# Patient Record
Sex: Female | Born: 1995 | Race: Black or African American | Hispanic: No | Marital: Married | Smoking: Never smoker
Health system: Southern US, Community
[De-identification: ages and names within clinical notes are randomized; demographics above are authoritative.]

## PROBLEM LIST (undated history)

## (undated) DIAGNOSIS — Z789 Other specified health status: Secondary | ICD-10-CM

## (undated) HISTORY — PX: NO PAST SURGERIES: SHX2092

## (undated) HISTORY — DX: Other specified health status: Z78.9

---
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
## 2017-11-06 IMAGING — CR DG CHEST 2V
2 series · 2 of 2 positions shown · non-contrast
Comparison: None.

CLINICAL DATA: Syncope.

EXAM:
CHEST  2 VIEW

[chest lat]
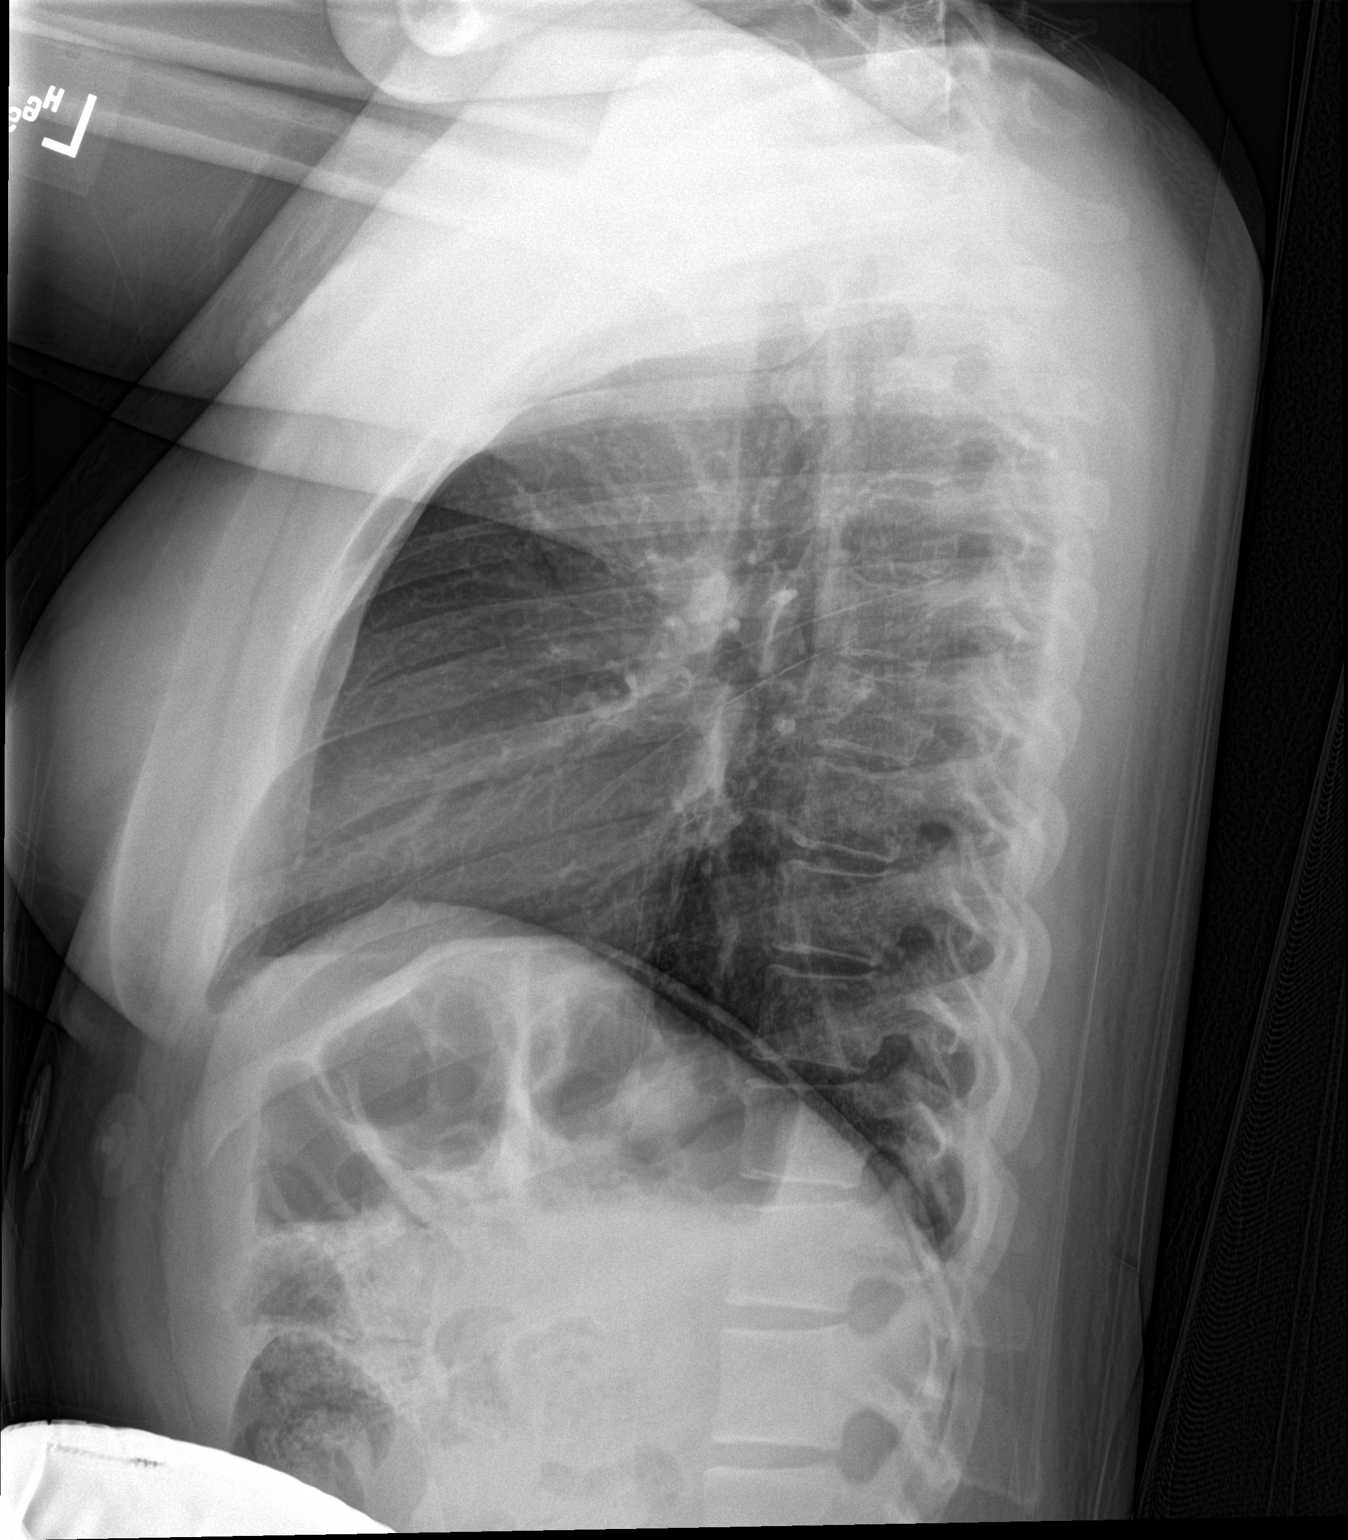

[chest ap]
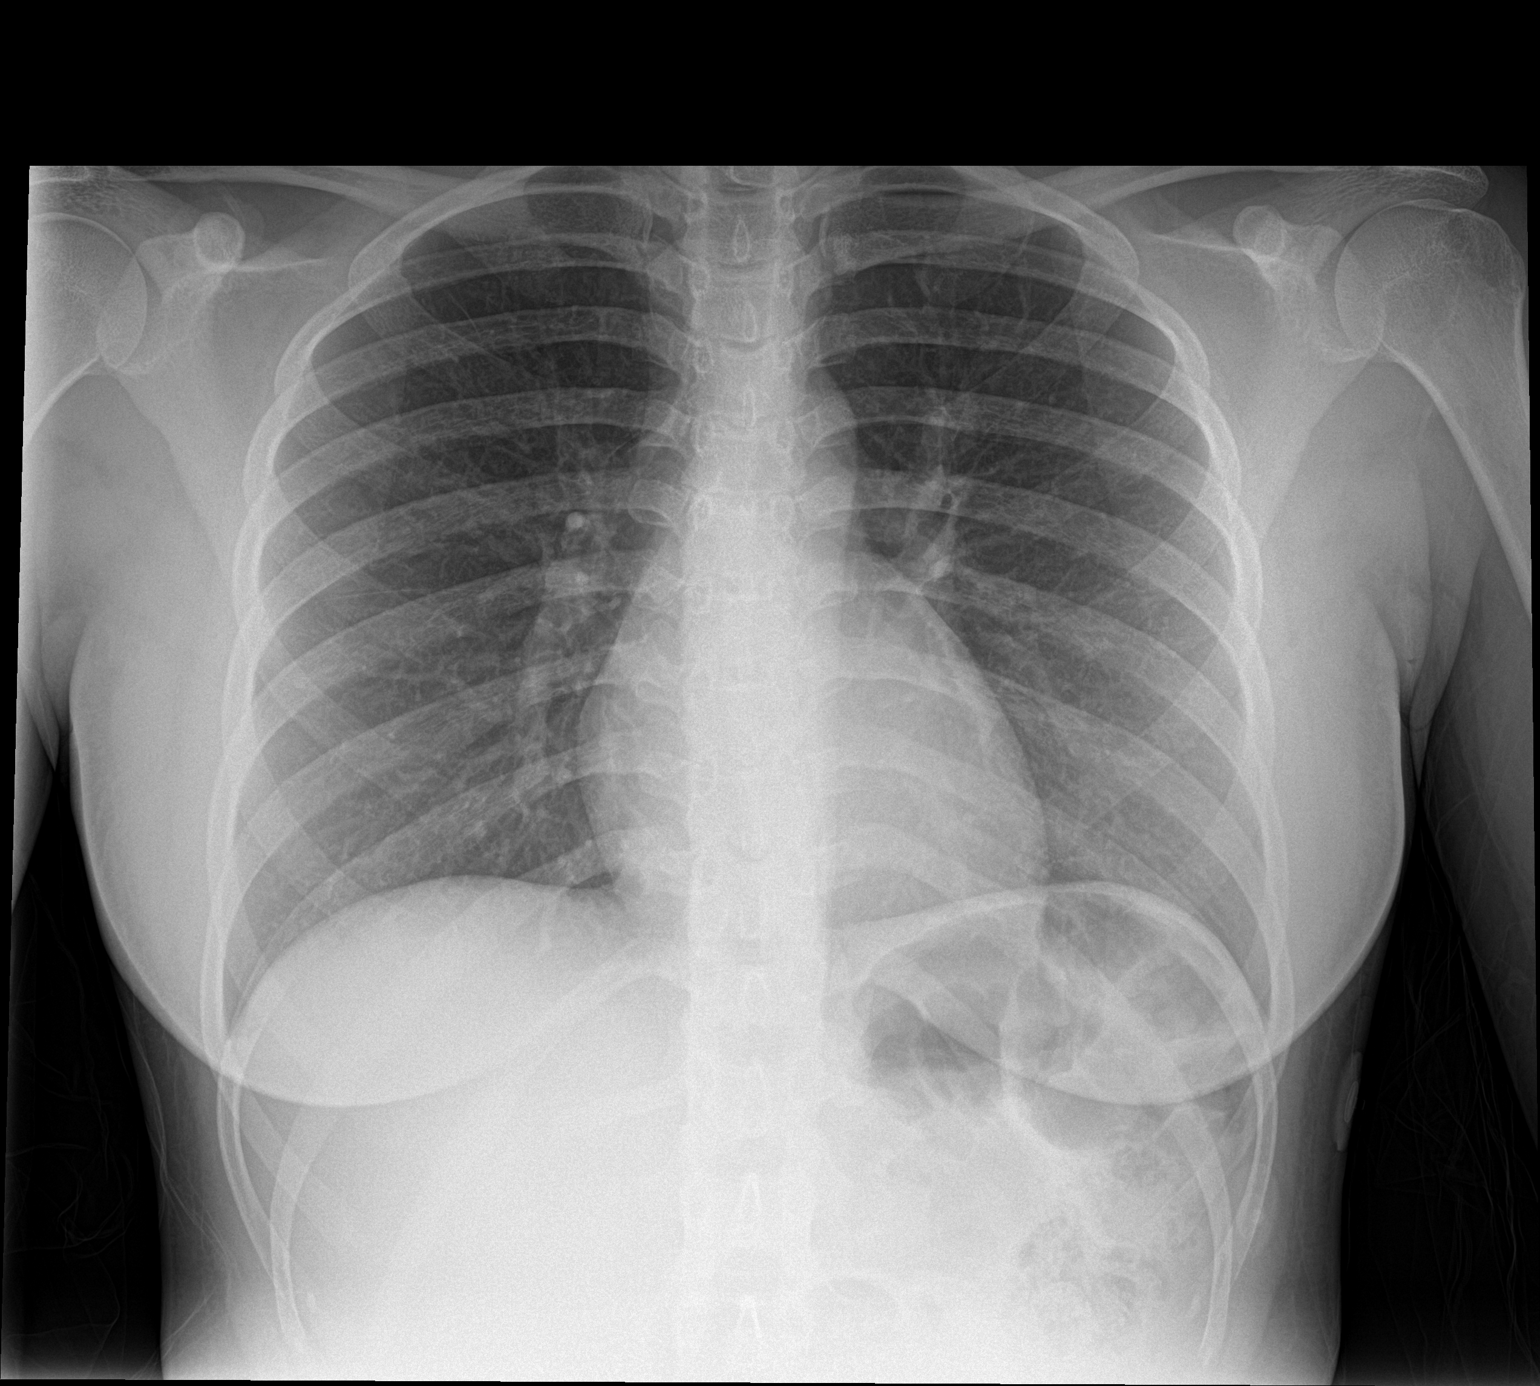

[2 of 2 positions shown; findings below may reference images not displayed]

FINDINGS: The heart size and mediastinal contours are within normal limits.
Both lungs are clear. The visualized skeletal structures are
unremarkable.
IMPRESSION: No active cardiopulmonary disease.

## 2019-02-18 ENCOUNTER — Ambulatory Visit: Payer: Self-pay | Attending: Internal Medicine

## 2019-02-18 DIAGNOSIS — Z20822 Contact with and (suspected) exposure to covid-19: Secondary | ICD-10-CM

## 2019-02-18 DIAGNOSIS — Z20828 Contact with and (suspected) exposure to other viral communicable diseases: Secondary | ICD-10-CM | POA: Insufficient documentation

## 2019-02-20 LAB — NOVEL CORONAVIRUS, NAA: SARS-CoV-2, NAA: NOT DETECTED

## 2019-03-12 DIAGNOSIS — Z13220 Encounter for screening for lipoid disorders: Secondary | ICD-10-CM | POA: Diagnosis not present

## 2019-03-12 DIAGNOSIS — Z79899 Other long term (current) drug therapy: Secondary | ICD-10-CM | POA: Diagnosis not present

## 2019-03-12 DIAGNOSIS — Z124 Encounter for screening for malignant neoplasm of cervix: Secondary | ICD-10-CM | POA: Diagnosis not present

## 2019-03-12 DIAGNOSIS — Z1159 Encounter for screening for other viral diseases: Secondary | ICD-10-CM | POA: Diagnosis not present

## 2019-03-12 DIAGNOSIS — Z130 Encounter for screening for diseases of the blood and blood-forming organs and certain disorders involving the immune mechanism: Secondary | ICD-10-CM | POA: Diagnosis not present

## 2019-03-12 DIAGNOSIS — Z23 Encounter for immunization: Secondary | ICD-10-CM | POA: Diagnosis not present

## 2019-03-12 DIAGNOSIS — Z01419 Encounter for gynecological examination (general) (routine) without abnormal findings: Secondary | ICD-10-CM | POA: Diagnosis not present

## 2019-03-12 LAB — LIPID PANEL
Cholesterol: 138 (ref 0–200)
HDL: 69 (ref 35–70)
LDL Cholesterol: 54
Triglycerides: 73 (ref 40–160)

## 2019-03-12 LAB — CBC: RBC: 4.11 (ref 3.87–5.11)

## 2019-03-12 LAB — COMPREHENSIVE METABOLIC PANEL: GFR calc Af Amer: 144

## 2019-03-12 LAB — BASIC METABOLIC PANEL
BUN: 13 (ref 4–21)
CO2: 25 — AB (ref 13–22)
Chloride: 103 (ref 99–108)
Glucose: 79
Potassium: 3.9 (ref 3.4–5.3)
Sodium: 137 (ref 137–147)

## 2019-03-12 LAB — CBC AND DIFFERENTIAL
HCT: 38 (ref 36–46)
Hemoglobin: 12.6 (ref 12.0–16.0)
Platelets: 243 (ref 150–399)
WBC: 6.2

## 2019-03-12 LAB — HEPATIC FUNCTION PANEL
ALT: 12 (ref 3–30)
AST: 20 (ref 2–40)
Alkaline Phosphatase: 36 (ref 25–125)

## 2019-03-12 LAB — HM PAP SMEAR: HM Pap smear: NEGATIVE

## 2019-12-25 ENCOUNTER — Ambulatory Visit: Payer: Self-pay | Admitting: Family Medicine

## 2020-01-09 ENCOUNTER — Ambulatory Visit (INDEPENDENT_AMBULATORY_CARE_PROVIDER_SITE_OTHER): Payer: BC Managed Care – PPO | Admitting: Family Medicine

## 2020-01-09 ENCOUNTER — Other Ambulatory Visit: Payer: Self-pay

## 2020-01-09 ENCOUNTER — Encounter: Payer: Self-pay | Admitting: Family Medicine

## 2020-01-09 VITALS — BP 100/70 | HR 93 | Temp 98.1°F | Ht <= 58 in | Wt 136.5 lb

## 2020-01-09 DIAGNOSIS — L731 Pseudofolliculitis barbae: Secondary | ICD-10-CM | POA: Insufficient documentation

## 2020-01-09 NOTE — Progress Notes (Signed)
Subjective:     Linda Kidd is a 24 y.o. female presenting for Establish Care and Adenopathy (under L armpit mostly, but occasionally under R, more frequently over last year)     HPI   #Swollen lymph nodes - pop up randomly - tried limiting deodorant, shaving - still have them - no association with menstrual period - sometimes painful, sometimes not - will last days to weeks -    Discussed with previous PCP in Jan 2021 and CBC w/ diff was normal  Review of Systems  Constitutional: Negative for chills, diaphoresis, fatigue, fever and unexpected weight change.  Gastrointestinal: Negative for abdominal pain, diarrhea, nausea and vomiting.  Musculoskeletal: Negative for arthralgias and myalgias.     Social History   Tobacco Use  Smoking Status Never Smoker  Smokeless Tobacco Never Used        Objective:    BP Readings from Last 3 Encounters:  01/09/20 100/70  09/27/15 99/68  01/16/15 119/72 (87 %, Z = 1.14 /  78 %, Z = 0.77)*   *BP percentiles are based on the 2017 AAP Clinical Practice Guideline for girls   Wt Readings from Last 3 Encounters:  01/09/20 136 lb 8 oz (61.9 kg)  09/27/15 117 lb (53.1 kg) (34 %, Z= -0.42)*  01/16/15 119 lb 1.6 oz (54 kg) (42 %, Z= -0.21)*   * Growth percentiles are based on CDC (Girls, 2-20 Years) data.    BP 100/70   Pulse 93   Temp 98.1 F (36.7 C) (Temporal)   Ht 4\' 9"  (1.448 m)   Wt 136 lb 8 oz (61.9 kg)   LMP 12/16/2019 (Exact Date)   SpO2 98%   BMI 29.54 kg/m    Physical Exam Constitutional:      General: She is not in acute distress.    Appearance: She is well-developed. She is not diaphoretic.  HENT:     Right Ear: External ear normal.     Left Ear: External ear normal.  Eyes:     Conjunctiva/sclera: Conjunctivae normal.  Cardiovascular:     Rate and Rhythm: Normal rate and regular rhythm.     Heart sounds: No murmur heard.   Pulmonary:     Effort: Pulmonary effort is normal. No respiratory  distress.     Breath sounds: Normal breath sounds.  Musculoskeletal:     Cervical back: Neck supple.  Lymphadenopathy:     Upper Body:     Right upper body: No axillary adenopathy.     Left upper body: No axillary adenopathy.  Skin:    General: Skin is warm and dry.     Capillary Refill: Capillary refill takes less than 2 seconds.     Comments: Left axilla with raised papule with Debois head.   Neurological:     Mental Status: She is alert. Mental status is at baseline.  Psychiatric:        Mood and Affect: Mood normal.        Behavior: Behavior normal.           Assessment & Plan:   Problem List Items Addressed This Visit      Musculoskeletal and Integument   Ingrown hair - Primary    Lesion on exam today is not consistent with prior lesions. Today more consistent with ingrown hair. Reviewed outside labs with normal CBC and CMP. Pt without systemic signs of illness and otherwise normal exam. She will return or send photo if symptoms recur. Can consider dermatology  if diagnosis unclear on repeat exam.           Return in about 1 year (around 01/08/2021) for annual.  Lynnda Child, MD  This visit occurred during the SARS-CoV-2 public health emergency.  Safety protocols were in place, including screening questions prior to the visit, additional usage of staff PPE, and extensive cleaning of exam room while observing appropriate contact time as indicated for disinfecting solutions.

## 2020-01-09 NOTE — Assessment & Plan Note (Signed)
Lesion on exam today is not consistent with prior lesions. Today more consistent with ingrown hair. Reviewed outside labs with normal CBC and CMP. Pt without systemic signs of illness and otherwise normal exam. She will return or send photo if symptoms recur. Can consider dermatology if diagnosis unclear on repeat exam.

## 2020-01-09 NOTE — Patient Instructions (Signed)
Make an appointment when you get the lesions  Or upload a photo

## 2020-01-30 ENCOUNTER — Encounter: Payer: Self-pay | Admitting: Family Medicine

## 2020-02-24 ENCOUNTER — Encounter: Payer: Self-pay | Admitting: Family Medicine

## 2020-02-24 MED ORDER — JUNEL FE 1/20 1-20 MG-MCG PO TABS: 1.0000 | ORAL_TABLET | Freq: Every day | ORAL | 2 refills | Status: DC

## 2020-03-03 ENCOUNTER — Other Ambulatory Visit: Payer: Self-pay

## 2020-03-03 ENCOUNTER — Encounter: Payer: Self-pay | Admitting: Family Medicine

## 2020-03-03 ENCOUNTER — Ambulatory Visit (INDEPENDENT_AMBULATORY_CARE_PROVIDER_SITE_OTHER): Payer: Self-pay | Admitting: Family Medicine

## 2020-03-03 VITALS — BP 100/78 | HR 91 | Temp 96.7°F | Ht <= 58 in | Wt 136.5 lb

## 2020-03-03 DIAGNOSIS — L509 Urticaria, unspecified: Secondary | ICD-10-CM | POA: Insufficient documentation

## 2020-03-03 NOTE — Progress Notes (Signed)
Subjective:     Linda Kidd is a 25 y.o. female presenting for Referral (Allergen test ) and Urticaria (Happens occasionally and appears mostly on stomach and lower back )     Urticaria This is a recurrent problem. The problem has been waxing and waning since onset. The affected locations include the abdomen, torso and back. The rash is characterized by itchiness, redness and swelling. She was exposed to nothing (cannot recall any exposures). Pertinent negatives include no congestion, cough, diarrhea, facial edema, fatigue, fever, joint pain, shortness of breath, sore throat or vomiting. Past treatments include antihistamine. The treatment provided significant relief. Her past medical history is significant for allergies (grass) and eczema. There is no history of asthma.   Hives have occurred on 2 occasions over the last month  Once ?Shellfish - crab legs and shrimp -- but ate 1 week later w/o change Episode 2: uncertain  No worsening allergies - about the same as before  No current hives   Review of Systems  Constitutional: Negative for fatigue and fever.  HENT: Negative for congestion and sore throat.   Respiratory: Negative for cough and shortness of breath.   Gastrointestinal: Negative for diarrhea and vomiting.  Musculoskeletal: Negative for joint pain.     Social History   Tobacco Use  Smoking Status Never Smoker  Smokeless Tobacco Never Used        Objective:    BP Readings from Last 3 Encounters:  03/03/20 100/78  01/09/20 100/70  09/27/15 99/68   Wt Readings from Last 3 Encounters:  03/03/20 136 lb 8 oz (61.9 kg)  01/09/20 136 lb 8 oz (61.9 kg)  09/27/15 117 lb (53.1 kg) (34 %, Z= -0.42)*   * Growth percentiles are based on CDC (Girls, 2-20 Years) data.    BP 100/78   Pulse 91   Temp (!) 96.7 F (35.9 C) (Temporal)   Ht 4\' 9"  (1.448 m)   Wt 136 lb 8 oz (61.9 kg)   LMP 02/14/2020 (Exact Date)   SpO2 100%   BMI 29.54 kg/m    Physical  Exam Constitutional:      General: She is not in acute distress.    Appearance: She is well-developed. She is not diaphoretic.  HENT:     Right Ear: External ear normal.     Left Ear: External ear normal.  Eyes:     Conjunctiva/sclera: Conjunctivae normal.  Cardiovascular:     Rate and Rhythm: Normal rate.  Pulmonary:     Effort: Pulmonary effort is normal.  Musculoskeletal:     Cervical back: Neck supple.  Skin:    General: Skin is warm and dry.     Capillary Refill: Capillary refill takes less than 2 seconds.  Neurological:     Mental Status: She is alert. Mental status is at baseline.  Psychiatric:        Mood and Affect: Mood normal.        Behavior: Behavior normal.           Assessment & Plan:   Problem List Items Addressed This Visit      Musculoskeletal and Integument   Urticaria - Primary    2 episodes of urticaria with quick resolution with benadryl. Advised continued monitoring. She will contact if recurrence persists and will plan for allergy referral.           Return if symptoms worsen or fail to improve.  02/16/2020, MD  This visit occurred during the  SARS-CoV-2 public health emergency.  Safety protocols were in place, including screening questions prior to the visit, additional usage of staff PPE, and extensive cleaning of exam room while observing appropriate contact time as indicated for disinfecting solutions.

## 2020-03-03 NOTE — Assessment & Plan Note (Signed)
2 episodes of urticaria with quick resolution with benadryl. Advised continued monitoring. She will contact if recurrence persists and will plan for allergy referral.

## 2020-03-03 NOTE — Patient Instructions (Addendum)
Continue to monitor - make sure clothes are clean - avoid scented products - keep a food journal -- 24 hours before hives, most important would be within 1 hour  Mychart if you decide you want to see the allergist

## 2020-05-11 ENCOUNTER — Other Ambulatory Visit: Payer: Self-pay | Admitting: Family Medicine

## 2020-10-13 ENCOUNTER — Encounter: Payer: Self-pay | Admitting: Family Medicine

## 2020-10-13 DIAGNOSIS — L0293 Carbuncle, unspecified: Secondary | ICD-10-CM

## 2020-10-13 NOTE — Addendum Note (Signed)
Addended by: Lynnda Child on: 10/13/2020 01:51 PM   Modules accepted: Orders

## 2021-02-04 ENCOUNTER — Other Ambulatory Visit: Payer: Self-pay | Admitting: Family Medicine

## 2021-02-04 NOTE — Telephone Encounter (Signed)
Lvm /mychart letter for pt to schedule a lab/cpe

## 2021-02-05 ENCOUNTER — Telehealth: Payer: Self-pay | Admitting: Family Medicine

## 2021-02-05 NOTE — Telephone Encounter (Signed)
2nd attempt  Call went straight to VM, LMTCB to schedule cpe and labs

## 2021-02-05 NOTE — Telephone Encounter (Signed)
Talk to pt and pt stated she will call back to schedule a cpe/alb because she just got married and husband is in [employer] and pt might be moving

## 2021-03-12 ENCOUNTER — Ambulatory Visit (INDEPENDENT_AMBULATORY_CARE_PROVIDER_SITE_OTHER): Payer: Self-pay | Admitting: Family

## 2021-03-12 ENCOUNTER — Other Ambulatory Visit: Payer: Self-pay

## 2021-03-12 ENCOUNTER — Encounter: Payer: Self-pay | Admitting: Family

## 2021-03-12 VITALS — BP 104/70 | HR 97 | Temp 96.8°F | Ht <= 58 in | Wt 139.0 lb

## 2021-03-12 DIAGNOSIS — Z23 Encounter for immunization: Secondary | ICD-10-CM

## 2021-03-12 DIAGNOSIS — Z13220 Encounter for screening for lipoid disorders: Secondary | ICD-10-CM

## 2021-03-12 DIAGNOSIS — Z0000 Encounter for general adult medical examination without abnormal findings: Secondary | ICD-10-CM

## 2021-03-12 DIAGNOSIS — L732 Hidradenitis suppurativa: Secondary | ICD-10-CM

## 2021-03-12 DIAGNOSIS — Z3041 Encounter for surveillance of contraceptive pills: Secondary | ICD-10-CM

## 2021-03-12 DIAGNOSIS — Z114 Encounter for screening for human immunodeficiency virus [HIV]: Secondary | ICD-10-CM

## 2021-03-12 LAB — COMPREHENSIVE METABOLIC PANEL
ALT: 9 U/L (ref 0–35)
AST: 19 U/L (ref 0–37)
Albumin: 4.5 g/dL (ref 3.5–5.2)
Alkaline Phosphatase: 50 U/L (ref 39–117)
BUN: 13 mg/dL (ref 6–23)
CO2: 28 mEq/L (ref 19–32)
Calcium: 9.2 mg/dL (ref 8.4–10.5)
Chloride: 104 mEq/L (ref 96–112)
Creatinine, Ser: 0.72 mg/dL (ref 0.40–1.20)
GFR: 117.51 mL/min (ref >60.00)
Glucose, Bld: 96 mg/dL (ref 70–99)
Potassium: 3.7 mEq/L (ref 3.5–5.1)
Sodium: 137 mEq/L (ref 135–145)
Total Bilirubin: 0.5 mg/dL (ref 0.2–1.2)
Total Protein: 7.9 g/dL (ref 6.0–8.3)

## 2021-03-12 LAB — CBC WITH DIFFERENTIAL/PLATELET
Basophils Absolute: 0 10*3/uL (ref 0.0–0.1)
Basophils Relative: 0.5 % (ref 0.0–3.0)
Eosinophils Absolute: 0.1 10*3/uL (ref 0.0–0.7)
Eosinophils Relative: 1 % (ref 0.0–5.0)
HCT: 39.2 % (ref 36.0–46.0)
Hemoglobin: 13.1 g/dL (ref 12.0–15.0)
Lymphocytes Relative: 32.6 % (ref 12.0–46.0)
Lymphs Abs: 1.9 10*3/uL (ref 0.7–4.0)
MCHC: 33.4 g/dL (ref 30.0–36.0)
MCV: 93.4 fl (ref 78.0–100.0)
Monocytes Absolute: 0.3 10*3/uL (ref 0.1–1.0)
Monocytes Relative: 5.2 % (ref 3.0–12.0)
Neutro Abs: 3.5 10*3/uL (ref 1.4–7.7)
Neutrophils Relative %: 60.7 % (ref 43.0–77.0)
Platelets: 249 10*3/uL (ref 150.0–400.0)
RBC: 4.2 Mil/uL (ref 3.87–5.11)
RDW: 12.8 % (ref 11.5–15.5)
WBC: 5.8 10*3/uL (ref 4.0–10.5)

## 2021-03-12 LAB — LIPID PANEL
Cholesterol: 139 mg/dL (ref 0–200)
HDL: 75.2 mg/dL (ref >39.00)
LDL Cholesterol: 49 mg/dL (ref 0–99)
NonHDL: 63.61
Total CHOL/HDL Ratio: 2
Triglycerides: 71 mg/dL (ref 0.0–149.0)
VLDL: 14.2 mg/dL (ref 0.0–40.0)

## 2021-03-12 LAB — POCT URINE PREGNANCY: Preg Test, Ur: NEGATIVE

## 2021-03-12 MED ORDER — JUNEL FE 1/20 1-20 MG-MCG PO TABS: 1.0000 | ORAL_TABLET | Freq: Every day | ORAL | 3 refills | Status: DC

## 2021-03-12 NOTE — Progress Notes (Signed)
Established Patient Office Visit  Subjective:  Patient ID: Linda Kidd, female    DOB: 1995-03-03  Age: 26 y.o. MRN: [id number]  CC:  Chief Complaint  Patient presents with   Annual Exam    HPI Linda Kidd is here today for an annual comprehensive exam, her husband is currently stationed in [place] with the [employer] and she is going to go and move with him to [place], [place] and requires paperwork documentation.   Pt is without acute concerns.  Requesting refill for junel   Pap: last 03/12/19, negative for hpv  Flu vaccine: will get this today  Chronic problems addressed today:  Hidradenitis suppurativa: states saw dermatology a few months ago and was dx with this, given a round of antbx and resolved symptoms.  Has improved with lack of birth control (stopped bc she ran out about a few months ago)   History reviewed. No pertinent past medical history.  History reviewed. No pertinent surgical history.  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  Tobacco Use   Smoking status: Never   Smokeless tobacco: Never  Vaping Use   Vaping Use: Never used  Substance and Sexual Activity   Alcohol use: Yes    Alcohol/week: 1.0 standard drink    Types: 1 Glasses of wine per week    Comment: a drink a week on Fridays   Drug use: Never   Sexual activity: Yes    Birth control/protection: Pill  Other Topics Concern   Not on file  Social History Narrative   01/09/20   From: the area   Living: living with parents, recently married, planning to move to [place]    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      Family: works with mom, good relationship with mom and  step dad, older brother and younger sister      Enjoys: friends and family, softball      Exercise: walking when she has time, apartment gym   Diet: more veggies, greens, fiber, chicken tuna      Safety   Seat belts: Yes    Guns: No   Safe in relationships: Yes    Social Determinants of Radio broadcast assistant Strain: Not on file  Food Insecurity: Not on file  Transportation Needs: Not on file  Physical Activity: Not on file  Stress: Not on file  Social Connections: Not on file  Intimate Partner Violence: Not on file    Outpatient Medications Prior to Visit  Medication Sig Dispense Refill   JUNEL FE 1/20 1-20 MG-MCG tablet TAKE 1 TABLET BY MOUTH EVERY DAY 28 tablet 9   diphenhydrAMINE HCl (BENADRYL ALLERGY PO) Take 2 tablets by mouth as needed (for hives).     No facility-administered medications prior to visit.    Allergies  Allergen Reactions   Acetaminophen-Caffeine Other (See Comments)    Feels like acid reflux   Excedrin Back & [Acetaminophen-Aspirin Buffered] Other (See Comments)    Gi burning    ROS Review of Systems  Review of Systems  Respiratory:  Negative for shortness of  breath.   Cardiovascular:  Negative for chest pain and palpitations.  Gastrointestinal:  Negative for constipation and diarrhea.  Genitourinary:  Negative for dysuria, frequency and urgency.  Musculoskeletal:  Negative for myalgias.  Psychiatric/Behavioral:  Negative for depression and suicidal ideas.   All other systems reviewed and are negative.    Objective:    Physical Exam  Gen: NAD, resting comfortably HEENT: TMs normal bilaterally. OP clear. No thyromegaly noted.  CV: RRR with no murmurs appreciated Pulm: NWOB, CTAB with no crackles, wheezes, or rhonchi GI: Normal bowel sounds present. Soft, Nontender, Nondistended. MSK: no edema, cyanosis, or clubbing noted Skin: warm, dry Neuro: CN2-12 grossly intact. Strength 5/5 in upper and lower extremities.  Psych: Normal  affect and thought content  BP 104/70    Pulse 97    Temp (!) 96.8 F (36 C) (Temporal)    Ht 4\' 9"  (1.448 m)    Wt 139 lb (63 kg)    LMP 03/11/2021 (Exact Date) Comment: freqency every month, last about 4-5 days, average flow   SpO2 98%    BMI 30.08 kg/m  Wt Readings from Last 3 Encounters:  03/12/21 139 lb (63 kg)  03/03/20 136 lb 8 oz (61.9 kg)  01/09/20 136 lb 8 oz (61.9 kg)     There are no preventive care reminders to display for this patient.   There are no preventive care reminders to display for this patient.  No results found for: TSH Lab Results  Component Value Date   WBC 6.2 03/12/2019   HGB 12.6 03/12/2019   HCT 38 03/12/2019   MCV 88.3 09/27/2015   PLT 243 03/12/2019   Lab Results  Component Value Date   NA 137 03/12/2019   K 3.9 03/12/2019   CO2 25 (A) 03/12/2019   GLUCOSE 104 (H) 09/27/2015   BUN 13 03/12/2019   CREATININE 0.54 09/27/2015   BILITOT 1.0 09/27/2015   ALKPHOS 36 03/12/2019   AST 20 03/12/2019   ALT 12 03/12/2019   PROT 7.9 09/27/2015   ALBUMIN 4.7 09/27/2015   CALCIUM 9.3 09/27/2015   ANIONGAP 7 09/27/2015   Lab Results  Component Value Date   CHOL 138 03/12/2019   Lab Results  Component Value Date   HDL 69 03/12/2019   Lab Results  Component Value Date   LDLCALC 54 03/12/2019   Lab Results  Component Value Date   TRIG 73 03/12/2019   No results found for: CHOLHDL No results found for: WSFK8L    Assessment & Plan:   Problem List Items Addressed This Visit       Musculoskeletal and Integument   Axillary hidradenitis suppurativa - Primary    Continue follow-up with dermatology when she will use clindamycin gel for outbreaks.  Handout given to patient patient as far as care measures.         Other   Encounter for birth control pills maintenance    HCT negative in office.  Refilled Junel patient to take as directed.  Instructed in the meantime while restarting the birth control to use extra protection such as  condoms during sexual intercourse      Relevant Orders   POCT urine pregnancy   Encounter for general adult medical examination without abnormal findings    Patient cleared for medication and education clearance for travel.  Cleared without limitations.  Patient Counseling(The following topics were reviewed):  -Nutrition: Stressed importance of moderation in sodium/caffeine intake, saturated fat and cholesterol, caloric balance, sufficient intake of  fresh fruits, vegetables, and fiber.  -Stressed the importance of regular exercise.   -Substance Abuse: Discussed cessation/primary prevention of tobacco, alcohol, or other drug use; driving or other dangerous activities under the influence; availability of treatment for abuse.   -Injury prevention: Discussed safety belts, safety helmets, smoke detector, smoking near bedding or upholstery.   -Sexuality: Discussed sexually transmitted diseases, partner selection, use of condoms, avoidance of unintended pregnancy and contraceptive alternatives.   -Dental health: Discussed importance of regular tooth brushing, flossing, and dental visits.  -Health maintenance and immunizations reviewed. Please refer to Health maintenance section.  Return to care in 1 year for next preventative visit       Relevant Orders   HIV Antibody (routine testing w rflx)   Lipid panel   CBC w/Diff   Comprehensive metabolic panel   Encounter for screening for lipid disorder    Lipid panel ordered pending results      Relevant Orders   Lipid panel   Screening for HIV without presence of risk factors    HIV screening never done before so we will order HIV has once in a lifetime recommendation.  Pending results      Relevant Orders   HIV Antibody (routine testing w rflx)   Encounter for vaccination    Patient received influenza vaccination today in office, prior to verbal consent was obtained,  administered and patient tolerated procedure well      Relevant Orders    Flu Vaccine QUAD 6+ mos PF IM (Fluarix Quad PF)    No orders of the defined types were placed in this encounter.   Follow-up: No follow-ups on file.    Eugenia Pancoast, FNP

## 2021-03-12 NOTE — Assessment & Plan Note (Signed)
Lipid panel ordered pending results.   

## 2021-03-12 NOTE — Assessment & Plan Note (Signed)
Continue follow-up with dermatology when she will use clindamycin gel for outbreaks.  Handout given to patient patient as far as care measures.

## 2021-03-12 NOTE — Assessment & Plan Note (Addendum)
Patient cleared for medication and education clearance for travel.  Cleared without limitations.  Patient Counseling(The following topics were reviewed):  -Nutrition: Stressed importance of moderation in sodium/caffeine intake, saturated fat and cholesterol, caloric balance, sufficient intake of fresh fruits, vegetables, and fiber.  -Stressed the importance of regular exercise.   -Substance Abuse: Discussed cessation/primary prevention of tobacco, alcohol, or other drug use; driving or other dangerous activities under the influence; availability of treatment for abuse.   -Injury prevention: Discussed safety belts, safety helmets, smoke detector, smoking near bedding or upholstery.   -Sexuality: Discussed sexually transmitted diseases, partner selection, use of condoms, avoidance of unintended pregnancy and contraceptive alternatives.   -Dental health: Discussed importance of regular tooth brushing, flossing, and dental visits.  -Health maintenance and immunizations reviewed. Please refer to Health maintenance section.  Return to care in 1 year for next preventative visit

## 2021-03-12 NOTE — Assessment & Plan Note (Signed)
HCT negative in office.  Refilled Junel patient to take as directed.  Instructed in the meantime while restarting the birth control to use extra protection such as condoms during sexual intercourse

## 2021-03-12 NOTE — Assessment & Plan Note (Signed)
HIV screening never done before so we will order HIV has once in a lifetime recommendation.  Pending results

## 2021-03-12 NOTE — Assessment & Plan Note (Signed)
Patient received influenza vaccination today in office, prior to verbal consent was obtained,  administered and patient tolerated procedure well

## 2021-03-15 LAB — HIV ANTIBODY (ROUTINE TESTING W REFLEX): HIV 1&2 Ab, 4th Generation: NONREACTIVE

## 2021-04-09 ENCOUNTER — Encounter: Payer: Self-pay | Admitting: Family Medicine

## 2021-09-06 LAB — OB RESULTS CONSOLE HEPATITIS B SURFACE ANTIGEN
Hepatitis B Surface Ag: NEGATIVE
Hepatitis B Surface Ag: NEGATIVE

## 2021-09-06 LAB — OB RESULTS CONSOLE RPR
RPR: NONREACTIVE
RPR: NONREACTIVE

## 2021-09-06 LAB — OB RESULTS CONSOLE ABO/RH: RH Type: POSITIVE

## 2021-09-06 LAB — OB RESULTS CONSOLE RUBELLA ANTIBODY, IGM
Rubella: IMMUNE
Rubella: IMMUNE

## 2021-09-06 LAB — OB RESULTS CONSOLE GC/CHLAMYDIA
Chlamydia: NEGATIVE
Chlamydia: NEGATIVE
Neisseria Gonorrhea: NEGATIVE
Neisseria Gonorrhea: NEGATIVE

## 2021-09-06 LAB — HEPATITIS C ANTIBODY
HCV Ab: NEGATIVE
HCV Ab: NEGATIVE

## 2021-09-06 LAB — OB RESULTS CONSOLE VARICELLA ZOSTER ANTIBODY, IGG
Varicella: IMMUNE
Varicella: IMMUNE

## 2021-09-06 LAB — OB RESULTS CONSOLE ANTIBODY SCREEN: Antibody Screen: NEGATIVE

## 2021-12-01 ENCOUNTER — Ambulatory Visit (INDEPENDENT_AMBULATORY_CARE_PROVIDER_SITE_OTHER): Admitting: Family Medicine

## 2021-12-01 ENCOUNTER — Encounter: Payer: Self-pay | Admitting: Family Medicine

## 2021-12-01 DIAGNOSIS — Z3A19 19 weeks gestation of pregnancy: Secondary | ICD-10-CM

## 2021-12-01 DIAGNOSIS — Z3402 Encounter for supervision of normal first pregnancy, second trimester: Secondary | ICD-10-CM

## 2021-12-01 NOTE — Progress Notes (Signed)
Declined any genetics

## 2021-12-01 NOTE — Progress Notes (Signed)
   PRENATAL VISIT NOTE  Subjective:  Linda Kidd is a 26 y.o. G1P0 at [redacted]w[redacted]d being seen today for transferring prenatal care from [place]. Husband is in [place] and deployed there.  She is currently monitored for the following issues for this low-risk pregnancy and has Axillary hidradenitis suppurativa and Encounter for supervision of normal first pregnancy in second trimester on their problem list.  Patient reports nausea and vomiting.  Contractions: Irritability. Vag. Bleeding: None.  Movement: Present. Denies leaking of fluid.   The following portions of the patient's history were reviewed and updated as appropriate: allergies, current medications, past family history, past medical history, past social history, past surgical history and problem list.   Objective:   Vitals:   12/01/21 1014  BP: 133/83  Pulse: (!) 106  Weight: 148 lb (67.1 kg)    Fetal Status: Fetal Heart Rate (bpm): 152   Movement: Present     General:  Alert, oriented and cooperative. Patient is in no acute distress.  Skin: Skin is warm and dry. No rash noted.   Cardiovascular: Normal heart rate noted  Respiratory: Normal respiratory effort, no problems with respiration noted  Abdomen: Soft, gravid, appropriate for gestational age.  Pain/Pressure: Absent     Pelvic: Cervical exam deferred        Extremities: Normal range of motion.  Edema: None  Mental Status: Normal mood and affect. Normal behavior. Normal judgment and thought content.   Assessment and Plan:  Pregnancy: G1P0 at [redacted]w[redacted]d 1. Encounter for supervision of normal first pregnancy in second trimester Continue routine prenatal care. Needs anatomy Declines genetics - [place] MFM OB COMP + 14 WK; Future  Preterm labor symptoms and general obstetric precautions including but not limited to vaginal bleeding, contractions, leaking of fluid and fetal movement were reviewed in detail with the patient. Please refer to After Visit Summary for other counseling  recommendations.   Return in 4 weeks (on 12/29/2021).  Future Appointments  Date Time Provider [place]  12/29/2021  9:35 AM Donnamae Jude, MD [facility]  01/27/2022  8:35 AM Anyanwu, Sallyanne Havers, MD [facility]    Donnamae Jude, MD

## 2021-12-23 ENCOUNTER — Encounter: Payer: Self-pay | Admitting: *Deleted

## 2021-12-23 ENCOUNTER — Other Ambulatory Visit: Payer: Self-pay | Admitting: *Deleted

## 2021-12-23 ENCOUNTER — Ambulatory Visit: Attending: Family Medicine

## 2021-12-23 ENCOUNTER — Ambulatory Visit: Admitting: *Deleted

## 2021-12-23 ENCOUNTER — Other Ambulatory Visit: Payer: Self-pay | Admitting: Family Medicine

## 2021-12-23 VITALS — BP 120/75 | HR 115

## 2021-12-23 DIAGNOSIS — Z3A22 22 weeks gestation of pregnancy: Secondary | ICD-10-CM | POA: Insufficient documentation

## 2021-12-23 DIAGNOSIS — Z363 Encounter for antenatal screening for malformations: Secondary | ICD-10-CM | POA: Insufficient documentation

## 2021-12-23 DIAGNOSIS — O99212 Obesity complicating pregnancy, second trimester: Secondary | ICD-10-CM | POA: Insufficient documentation

## 2021-12-23 DIAGNOSIS — Z362 Encounter for other antenatal screening follow-up: Secondary | ICD-10-CM

## 2021-12-23 DIAGNOSIS — Z3402 Encounter for supervision of normal first pregnancy, second trimester: Secondary | ICD-10-CM | POA: Diagnosis not present

## 2021-12-23 DIAGNOSIS — R638 Other symptoms and signs concerning food and fluid intake: Secondary | ICD-10-CM

## 2021-12-29 ENCOUNTER — Ambulatory Visit (INDEPENDENT_AMBULATORY_CARE_PROVIDER_SITE_OTHER): Admitting: Family Medicine

## 2021-12-29 ENCOUNTER — Encounter: Payer: Self-pay | Admitting: Family Medicine

## 2021-12-29 VITALS — BP 122/77 | HR 116 | Wt 154.0 lb

## 2021-12-29 DIAGNOSIS — Z3402 Encounter for supervision of normal first pregnancy, second trimester: Secondary | ICD-10-CM

## 2021-12-29 DIAGNOSIS — Z3A23 23 weeks gestation of pregnancy: Secondary | ICD-10-CM

## 2021-12-29 NOTE — Progress Notes (Signed)
ROB [redacted]w[redacted]d  CC: None   Flu vaccine : Declined

## 2021-12-29 NOTE — Progress Notes (Signed)
   PRENATAL VISIT NOTE  Subjective:  Linda Kidd is a 26 y.o. G1P0 at [redacted]w[redacted]d being seen today for ongoing prenatal care.  She is currently monitored for the following issues for this low-risk pregnancy and has Axillary hidradenitis suppurativa and Encounter for supervision of normal first pregnancy in second trimester on their problem list.  Patient reports no complaints.  Contractions: Not present. Vag. Bleeding: None.  Movement: Present. Denies leaking of fluid.   The following portions of the patient's history were reviewed and updated as appropriate: allergies, current medications, past family history, past medical history, past social history, past surgical history and problem list.   Objective:   Vitals:   12/29/21 0947  BP: 122/77  Pulse: (!) 116  Weight: 154 lb (69.9 kg)    Fetal Status: Fetal Heart Rate (bpm): 156 Fundal Height: 23 cm Movement: Present     General:  Alert, oriented and cooperative. Patient is in no acute distress.  Skin: Skin is warm and dry. No rash noted.   Cardiovascular: Normal heart rate noted  Respiratory: Normal respiratory effort, no problems with respiration noted  Abdomen: Soft, gravid, appropriate for gestational age.  Pain/Pressure: Present     Pelvic: Cervical exam deferred        Extremities: Normal range of motion.  Edema: None  Mental Status: Normal mood and affect. Normal behavior. Normal judgment and thought content.   Assessment and Plan:  Pregnancy: G1P0 at [redacted]w[redacted]d 1. Encounter for supervision of normal first pregnancy in second trimester Continue routine prenatal care.   Preterm labor symptoms and general obstetric precautions including but not limited to vaginal bleeding, contractions, leaking of fluid and fetal movement were reviewed in detail with the patient. Please refer to After Visit Summary for other counseling recommendations.   Return in 4 weeks (on 01/26/2022) for 28 wk labs.  Future Appointments  Date Time Provider  Department Center  01/20/2022  7:45 AM [facility] NURSE [facility]  01/20/2022  8:00 AM [facility] US1 [facility]  01/27/2022  8:35 AM Anyanwu, Jethro Bastos, MD [facility]  02/17/2022  8:55 AM Calvert Cantor, CNM [facility]    Reva Bores, MD

## 2022-01-20 ENCOUNTER — Other Ambulatory Visit: Payer: Self-pay | Admitting: *Deleted

## 2022-01-20 ENCOUNTER — Ambulatory Visit: Attending: Maternal & Fetal Medicine

## 2022-01-20 ENCOUNTER — Ambulatory Visit: Admitting: *Deleted

## 2022-01-20 VITALS — BP 134/69 | HR 119

## 2022-01-20 DIAGNOSIS — Z3689 Encounter for other specified antenatal screening: Secondary | ICD-10-CM

## 2022-01-20 DIAGNOSIS — R638 Other symptoms and signs concerning food and fluid intake: Secondary | ICD-10-CM | POA: Diagnosis not present

## 2022-01-20 DIAGNOSIS — Z362 Encounter for other antenatal screening follow-up: Secondary | ICD-10-CM | POA: Insufficient documentation

## 2022-01-20 DIAGNOSIS — Z3A26 26 weeks gestation of pregnancy: Secondary | ICD-10-CM

## 2022-01-20 DIAGNOSIS — E669 Obesity, unspecified: Secondary | ICD-10-CM | POA: Diagnosis not present

## 2022-01-20 DIAGNOSIS — O99212 Obesity complicating pregnancy, second trimester: Secondary | ICD-10-CM | POA: Insufficient documentation

## 2022-01-24 ENCOUNTER — Encounter: Payer: Self-pay | Admitting: Family Medicine

## 2022-01-27 ENCOUNTER — Ambulatory Visit (INDEPENDENT_AMBULATORY_CARE_PROVIDER_SITE_OTHER): Admitting: Obstetrics & Gynecology

## 2022-01-27 ENCOUNTER — Other Ambulatory Visit

## 2022-01-27 ENCOUNTER — Encounter: Payer: Self-pay | Admitting: Obstetrics & Gynecology

## 2022-01-27 VITALS — BP 122/81 | HR 112 | Wt 163.0 lb

## 2022-01-27 DIAGNOSIS — O99013 Anemia complicating pregnancy, third trimester: Secondary | ICD-10-CM

## 2022-01-27 DIAGNOSIS — Z3402 Encounter for supervision of normal first pregnancy, second trimester: Secondary | ICD-10-CM

## 2022-01-27 DIAGNOSIS — Z1332 Encounter for screening for maternal depression: Secondary | ICD-10-CM | POA: Diagnosis not present

## 2022-01-27 DIAGNOSIS — Z3A27 27 weeks gestation of pregnancy: Secondary | ICD-10-CM

## 2022-01-27 LAB — OB RESULTS CONSOLE HIV ANTIBODY (ROUTINE TESTING): HIV: NONREACTIVE

## 2022-01-27 LAB — OB RESULTS CONSOLE RPR: RPR: NONREACTIVE

## 2022-01-27 NOTE — Patient Instructions (Addendum)
TDaP Vaccine Pregnancy Get the Whooping Cough Vaccine While You Are Pregnant (CDC)  It is important for women to get the whooping cough vaccine in the third trimester of each pregnancy. Vaccines are the best way to prevent this disease. There are 2 different whooping cough vaccines. Both vaccines combine protection against whooping cough, tetanus and diphtheria, but they are for different age groups: Tdap: for everyone 11 years or older, including pregnant women  DTaP: for children 2 months through 6 years of age  You need the whooping cough vaccine during each of your pregnancies The recommended time to get the shot is during your 27th through 36th week of pregnancy, preferably during the earlier part of this time period. The Centers for Disease Control and Prevention (CDC) recommends that pregnant women receive the whooping cough vaccine for adolescents and adults (called Tdap vaccine) during the third trimester of each pregnancy. The recommended time to get the shot is during your 27th through 36th week of pregnancy, preferably during the earlier part of this time period. This replaces the original recommendation that pregnant women get the vaccine only if they had not previously received it. The American College of Obstetricians and Gynecologists and the American College of Nurse-Midwives support this recommendation.  You should get the whooping cough vaccine while pregnant to pass protection to your baby frame support disabled and/or not supported in this browser  Learn why Linda Kidd decided to get the whooping cough vaccine in her 3rd trimester of pregnancy and how her baby girl was born with some protection against the disease. Also available on YouTube. After receiving the whooping cough vaccine, your body will create protective antibodies (proteins produced by the body to fight off diseases) and pass some of them to your baby before birth. These antibodies provide your baby some short-term  protection against whooping cough in early life. These antibodies can also protect your baby from some of the more serious complications that come along with whooping cough. Your protective antibodies are at their highest about 2 weeks after getting the vaccine, but it takes time to pass them to your baby. So the preferred time to get the whooping cough vaccine is early in your third trimester. The amount of whooping cough antibodies in your body decreases over time. That is why CDC recommends you get a whooping cough vaccine during each pregnancy. Doing so allows each of your babies to get the greatest number of protective antibodies from you. This means each of your babies will get the best protection possible against this disease.  Getting the whooping cough vaccine while pregnant is better than getting the vaccine after you give birth Whooping cough vaccination during pregnancy is ideal so your baby will have short-term protection as soon as he is born. This early protection is important because your baby will not start getting his whooping cough vaccines until he is 2 months old. These first few months of life are when your baby is at greatest risk for catching whooping cough. This is also when he's at greatest risk for having severe, potentially life-threating complications from the infection. To avoid that gap in protection, it is best to get a whooping cough vaccine during pregnancy. You will then pass protection to your baby before he is born. To continue protecting your baby, he should get whooping cough vaccines starting at 2 months old. You may never have gotten the Tdap vaccine before and did not get it during this pregnancy. If so, you should make sure   to get the vaccine immediately after you give birth, before leaving the hospital or birthing center. It will take about 2 weeks before your body develops protection (antibodies) in response to the vaccine. Once you have protection from the vaccine,  you are less likely to give whooping cough to your newborn while caring for him. But remember, your baby will still be at risk for catching whooping cough from others. A recent study looked to see how effective Tdap was at preventing whooping cough in babies whose mothers got the vaccine while pregnant or in the hospital after giving birth. The study found that getting Tdap between 27 through 36 weeks of pregnancy is 85% more effective at preventing whooping cough in babies younger than 2 months old. Blood tests cannot tell if you need a whooping cough vaccine There are no blood tests that can tell you if you have enough antibodies in your body to protect yourself or your baby against whooping cough. Even if you have been sick with whooping cough in the past or previously received the vaccine, you still should get the vaccine during each pregnancy. Breastfeeding may pass some protective antibodies onto your baby By breastfeeding, you may pass some antibodies you have made in response to the vaccine to your baby. When you get a whooping cough vaccine during your pregnancy, you will have antibodies in your breast milk that you can share with your baby as soon as your milk comes in. However, your baby will not get protective antibodies immediately if you wait to get the whooping cough vaccine until after delivering your baby. This is because it takes about 2 weeks for your body to create antibodies. Learn more about the health benefits of breastfeeding.   AREA PEDIATRIC/FAMILY PRACTICE PHYSICIANS  [place] ([address]) [facility] Chambliss, MD; Eniola, MD; Hale, MD; Hensel, MD; McDiarmid, MD; McIntyer, MD; Neal, MD; [place], MD [address] [place], [place] [address] [phone] Mon-Fri 8:30-12:30, 1:30-5:00 Providers come to see babies at [facility] Accepting Medicaid [facility] providers who accept newborns: Koirala,  MD; Morrow, MD; Wolters, MD [address], [place], [place] [address] [phone] Mon-Fri 8:00-5:30 Babies seen by providers at [facility] Does NOT accept Medicaid Please call early in hospitalization for appointment (limited availability)  [facility] Mulberry, MD [address] [place], [place] [address] [phone] Mon, Tue, Thur, Fri 8:30-5:00, Wed 10:00-7:00 (closed 1-2pm) Babies seen by [facility] providers Accepting Medicaid Rubin - Pediatrician Rubin, MD [address], [place], [place] [address] [phone] Mon-Fri 8:30-5:00, Sat 8:30-12:00 Provider comes to see babies at [facility] Accepting Medicaid Must have been referred from current patients or contacted office prior to delivery [facility] ([facility]) Brown, MD; Chandler, MD; Ettefagh, MD; Grant, MD; Lester, MD; McCormick, MD; McQueen, MD; Prose, MD; Simha, MD; Stanley, MD; Stryffeler, NP; Tebben, NP [address], [place], [place] [address] [phone] Mon, Tue, Thur, Fri 8:30-5:30, Wed 9:30-5:30, Sat 8:30-12:30 Babies seen by [facility] providers Accepting Medicaid Only accepting infants of first-time parents or siblings of current patients Hospital discharge coordinator will make follow-up appointment Jack Amos [address], [place], [place]  [address] [phone]   Fax - [phone] [facility] [address], , [place]  [address] Phone - [phone]   Fax - [phone] Shilpa Gosrani [address], , [place]    [address] [phone]  [place] ([address]) [facility] Bates, MD; Brassfield, MD; Cooper, Cox, MD; MD; Davis, MD; Dovico, MD; Ettefaugh, MD; Little, MD; Lowe, MD; Keiffer, MD; Melvin, MD; Sumner, MD; Williams, MD [address], [place], [place] [address] [phone] Mon-Fri  8:30-5:00 (extended evenings Mon-Thur as needed), Sat-Sun 10:00-1:00 Providers come to see babies at [facility] Accepting Medicaid for families of first-time babies and families with all children in the household age 3 and under. Must register with office prior to making appointment (M-F only). [facility] Henson, NP; Knapp, MD; Lalonde, MD; Tysinger, PA [address] [place], [place] [address] [phone] Mon-Fri 8:00-5:00 Babies seen by providers at [facility] Does NOT accept Medicaid/Commercial Insurance Only [facility] ([facility])  Artis, MD; Barnes, MD; Bratton, MD; Coccaro, MD; [place] Gardner, MD; Kramer, MD; Marshall, MD; Netherton, MD; Poleto, MD; Skinner, MD [address] [place], [place] [address] [phone] Mon-Fri 8:30-5:30, Sat (Oct.-Mar.) 9:00-1:00 Babies seen by providers at [facility] Accepting Medicaid  [place] ([address]) [facility] Reid, MD; [place], MD [address], [place], [place] [address] [phone] Mon-Fri 8:30-5:00, Sat 8:30-12:00 Providers come to see babies at [facility] Does NOT accept Medicaid [facility] Becker, PA; Hagler, MD; Scifres, PA; Sun, MD; Swayne, MD [address], [place], [place] [address] [phone] Mon-Fri 8:00-5:00 Babies seen by providers at [facility] Does NOT accept Medicaid Only accepting babies of parents who are patients Please call early in hospitalization for appointment (limited availability) [facility] Clark, MD; Frye, MD; Kelleher, MD; Mack, NP; Miller, MD; O'Keller, MD; Patterson, NP; Pudlo, MD; Puzio, MD; Thomas, MD; Tucker, MD; Twiselton, MD [address], [place], [place] [address] [phone] Mon-Fri 8:00-5:00, Sat 9:00-12:00 Providers come to see babies at [facility] Does NOT accept Medicaid  [place] ([address]) [facility] providers accepting new patients: Brake, NP; Wharton, PA [address], [place], [place] [address] [phone] Mon-Fri 8:00-5:00 Babies seen by providers at [facility] Does NOT accept Medicaid Only accepting babies of parents who are patients Please call early in hospitalization for appointment (limited availability) [facility] Gay, MD; Quinlan, MD [address] [place], [place] [address] [phone] (press 1 to schedule appointment) Mon-Fri 8:00-5:00 Providers come to see babies at [facility] Does NOT accept Medicaid [facility] Mazer, MD [address] [place], [place] [address] [phone] Mon-Fri 8:30-5:00 (lunch 12:30-1:00), extended hours by appointment only Wed 5:00-6:30 Babies seen by [facility] providers Accepting Medicaid [facility] Banks, MD; Jordan, MD; Koberlein, MD [address], [place], [place] [address] [phone] Mon-Fri 8:00-5:00 Babies seen by [facility] providers Does NOT accept Medicaid [facility] Parker, MD; Hunter, MD; Wallace, DO [address] [place], [place] [address] [phone] Mon-Fri 8:00-5:00 Babies seen by [facility] providers Does NOT accept Medicaid [facility] Brandon, PA; Brecken, PA; Christy, NP; Dees, MD; DeClaire, MD; DeWeese, MD; Hansen, NP; Mills, NP; Parrish, NP; Smoot, NP; Summer, MD; Vapne, MD [address] [place],  [address] [phone] Mon-Fri 8:30-5:00, Sat 10:00-1:00 Providers come to see babies at [facility] Does NOT accept Medicaid Free prenatal information session Tuesdays at 4:45pm [facility] Bouska, MD; Gordon, PA; Jeffery, PA; Weber, PA [address] [place]  [address] [phone] Mon-Fri 7:30-5:30 Babies seen by [facility] providers   Children's Doctor [address], [place], [place]  [address] [phone]   Fax - [phone]  [place] ([address] & [address]) [facility] [place] [address] [place], [place] [address] [phone] Mon-Thur 8:00-6:00 Providers come to see babies at [facility] Accepting Medicaid [facility] Anderson, NP; Badger, MD; Beal, PA; Spencer, PA [address] [place], [place] [address] [phone] Mon-Thur 7:30-7:30, Fri 7:30-4:30 Babies seen by [facility] providers Accepting Medicaid [facility] Agbuya, MD; Klett, NP; Romgoolam, MD [address], [place], [place] [address] [phone] Mon-Fri 8:30-5:00, Sat 8:30-12:00 Providers come to see babies at [facility] Accepting Medicaid Must have "Meet & Greet" appointment at office prior to delivery [facility]) McCord, MD; Wallace, MD; Wood, MD [address], [place], [place] [address] [phone] Mon-Wed 8:00-6:00, Thur-Fri 8:00-5:00, Sat 9:00-12:00 Providers come to see babies at [facility] Does NOT accept Medicaid Only accepting siblings of current patients [facility]  [address], [place], [place]  [address] [phone]   Fax - [phone] [facility] [address], [place], [place]  [address] [phone]   Fax - [phone]  [place] ([address] & [address]) [facility] Cirigliano, DO; Matthews, DO [address] , [place] [address] [phone] Mon-Fri 7:00-5:00 Babies seen by [facility] providers Does NOT accept Medicaid [facility] Briscoe, MD; Howley, PA; Moreira, PA [address], [place], [place] [address] [phone] Mon-Fri 8:00-5:00 [facility]ies [facility] by [facility] providers Accepting Medicaid [facility] Boyd, MD; Church, PA; Jones, NP; Osborn, PA [address], , [place] [address] [phone] Mon-Fri 8:00-5:00 Babies seen by providers at [facility] Accepting Medicaid  [place] ([address]) [facility] [place], DO [address] [place], [place] [address] [phone] Mon-Fri 8:00-5:00 Babies seen by [facility] providers Does NOT accept Medicaid Limited availability, please call early in hospitalization to schedule follow-up [facility] Calderon, PA; Cummings, MD; Dillard, MD; Martin, PA; Olson, MD; VanDeven, PA [address], [place], [place] [address] [phone] Mon-Fri 8:30-5:00, Sat 9:00-12:00 Babies seen by providers at [facility] Accepting Medicaid Please register online then schedule online or call office [email or web address]) Hunter, NP; Kumar, MD; Martin Rogers, PA [address], [place], [place] [address] [phone] Mon-Fri 8:00-5:00 Babies seen by providers at [facility] Accepting Medicaid [facility] ([facility]) [place], MD; Kristi Fleenor, NP; West, MD [address], [place], [place] [address] [phone] Mon-Fri 8:00-5:30, Sat&Sun by appointment (phones open at 8:30) Babies seen by [facility] providers Accepting Medicaid Must be a first-time baby or sibling of current patient [facility] [place]  [address], [place], [place]  [address] [phone]   Fax - [phone]  [place] ([address]) [facility] Brown, PA; Cowen, PA; Rice, MD; Helton, PA; Spry, MD [address] [place], [place] [address] [phone] Mon-Thur 8:00-7:00, Fri 8:00-5:00, Sat 8:00-12:00, Sun  9:00-12:00 Babies seen by [facility] providers Accepting Medicaid [facility] Coe-Goins, MD; Marshall, MD; Pierre-Louis, MD [address], [place], [place] [address] [phone] Mon-Thur 8:00-5:00 Babies seen by providers at [facility] Accepting   Medicaid [facility] Bratton, MD; Coe-Goins, MD; Hayes, MD; Lewis, MD; List, MD; Lott, MD; Marshall, MD; Moran, MD; O'Neal, MD; Pierre-Louis, MD; Pitonzo, MD; Scholer, MD; Spangle, MD [address] [place], [place] [address] [phone] Mon-Fri 8:00-5:30, Sat (Oct.-Mar.) 9:00-1:00 Babies seen by providers at [facility] Accepting Medicaid Must fill out new patient packet, available online at [email or web address] [facility] ([facility]) Friddle, NP; Harris, NP; Kelly, NP; Logan, MD; Melvin, PA; Poth, MD; Ramadoss, MD; Stanton, NP [address], [place], [place] [address] [phone] Mon-Thur 8:00-5:30, Fri 8:00-5:00 Babies seen by providers at [facility] Accepting Medicaid  [place] ([address]) [facility] Dixon, PA; Reno, MD; Pickard, MD; Tapia, PA [address], [place], [place] [address] [phone] Mon-Fri 8:00-5:00 Babies seen by providers at [facility] Accepting Medicaid   [place] ([address]) [facility] [place] Masneri, DO; Meyers, MD; Nelson, PA [address], [place], [place] [address] [phone] Mon-Fri 8:00-5:00 Babies seen by providers at [facility] Does NOT accept Medicaid Limited appointment availability, please call early in hospitalization  [facility] [place] Kunedd, DO; McGowen, MD [address], [place], [place] [address] [phone] Mon-Fri 8:00-5:00 Babies seen by [facility] providers Does NOT accept Medicaid [facility] [place] Cameron, MD; MacDonald, MD; Michaels, PA; Nayak, MD [address] [place] [address], [place], [place] [address] [phone] Mon-Fri 8:00-5:00 After hours clinic ([address] [place], [place] [address]) [phone] Mon-Fri 5:00-8:00, Sat 12:00-6:00, Sun 10:00-4:00 Babies seen by [facility] providers Accepting Medicaid [facility] [place] [address], [place], [place]  [address] [phone]   Fax - [phone]  [place] ([address]) [facility] [place] Andy, MD [address], [place], [place] [address] [phone] Mon-Fri 8:00-5:00 Babies seen by [facility] providers Does NOT accept Medicaid [facility] [place] ([facility] [place]) Eksir, MD [address], [place], [place] [address] [phone] Mon-Thur 8:00-7:00, Fri 8:00-5:00, Sat 8:00-12:00 Babies seen by providers at [facility] Accepting Medicaid - but does not have vaccinations in office (must be received elsewhere) Limited availability, please call early in hospitalization  [place] ([address]) [facility]  Charlene Flemming, MD [address], [place] [address] [phone]  Fax [phone]  [facility]  Kimberly Newton, MD, Annamarie Streilein, PA, Carla Hampton, PA [address] [place] [place], [place] [address] [phone] [facility]  [address], [place], [place] [address] [phone] [address], [place], [place] [address] [phone] (West Office)  [facility] [address], [place], [place] [address] [phone] [facility] [address], [place], [place] [address] [phone] [facility] [address], [place], [place] [address] [phone] [facility] [address], [place], [place] [address] [phone] [facility] [address], [place], [place] [address] [phone] [facility] [address], [place], [place] [address] [phone] [facility]  [address], [place], [place] [address] [phone] Dr[facility] [address] [place] [address], [place], [place] [address] [phone] [facility] [address], [place],  [address] [phone] [facility] [address], [place],  [address] [phone]   

## 2022-01-27 NOTE — Progress Notes (Signed)
PRENATAL VISIT NOTE  Subjective:  Linda Kidd is a 26 y.o. G1P0 at [redacted]w[redacted]d being seen today for ongoing prenatal care.  She is currently monitored for the following issues for this low-risk pregnancy and has Axillary hidradenitis suppurativa and Encounter for supervision of normal first pregnancy in second trimester on their problem list.  Patient reports no complaints.  Contractions: Not present. Vag. Bleeding: None.  Movement: Present. Denies leaking of fluid.   The following portions of the patient's history were reviewed and updated as appropriate: allergies, current medications, past family history, past medical history, past social history, past surgical history and problem list.   Objective:   Vitals:   01/27/22 0850  BP: 122/81  Pulse: (!) 112  Weight: 163 lb (73.9 kg)    Fetal Status: Fetal Heart Rate (bpm): 150   Movement: Present     General:  Alert, oriented and cooperative. Patient is in no acute distress.  Skin: Skin is warm and dry. No rash noted.   Cardiovascular: Normal heart rate noted  Respiratory: Normal respiratory effort, no problems with respiration noted  Abdomen: Soft, gravid, appropriate for gestational age.  Pain/Pressure: Present     Pelvic: Cervical exam deferred        Extremities: Normal range of motion.  Edema: None  Mental Status: Normal mood and affect. Normal behavior. Normal judgment and thought content.   [place] [facility] OB FOLLOW UP  Result Date: 01/20/2022 ----------------------------------------------------------------------  OBSTETRICS REPORT                       (Signed Final 01/20/2022 09:09 am) ---------------------------------------------------------------------- Patient Info  ID #:       [id number]                          D.O.B.:  1996-01-04 (26 yrs)  Name:       Linda Kidd                Visit Date: 01/20/2022 08:33 am ---------------------------------------------------------------------- Performed By  Attending:        Tama High MD         Ref. Address:     Linda Kidd  Performed By:     Linda Kidd         Location:         [facility]                    [facility]                                     [facility]                                                             [facility]  Referred By:      Linda Kidd Linda Kidd ---------------------------------------------------------------------- Orders  #  Description                           Code        Ordered By  1  [place] MFM OB FOLLOW UP                   [phone]    Linda Kidd ----------------------------------------------------------------------  #  Order #                     Accession #                Episode #  1  [id number]                   [id number]                 [id number] ---------------------------------------------------------------------- Indications  [redacted] weeks gestation of pregnancy                Z3A.26  Obesity complicating pregnancy, second         O99.212  trimester (BMI 31)  Antenatal follow-up for nonvisualized fetal    Z36.2  anatomy  Declined genetic testing  Obesity complicating pregnancy, second         O99.212  trimester (pre-G bmi: 31) ---------------------------------------------------------------------- Vital Signs  BP:          134/69 ---------------------------------------------------------------------- Fetal Evaluation  Num Of Fetuses:         1  Fetal Heart Rate(bpm):  136  Cardiac Activity:       Observed  Presentation:           Cephalic  Placenta:               Posterior  P. Cord Insertion:      Previously Visualized  Amniotic Fluid  AFI FV:      Within normal limits  AFI Sum(cm)     %Tile       Largest Pocket(cm)  10.6            14          4.6  RUQ(cm)       RLQ(cm)       LUQ(cm)        LLQ(cm)  0             3.3           4.6            2.7 ----------------------------------------------------------------------  Biometry  BPD:      66.2  mm     G. Age:  26w 5d         43  %    CI:        74.79   %    70 - 86                                                          FL/HC:      19.6   %    18.6 - 20.4  HC:      242.9  mm     G. Age:  26w 3d  18  %    HC/AC:      1.11        1.05 - 1.21  AC:      218.3  mm     G. Age:  26w 2d         33  %    FL/BPD:     71.8   %    71 - 87  FL:       47.5  mm     G. Age:  25w 6d         17  %    FL/AC:      21.8   %    20 - 24  CER:      30.5  mm     G. Age:  26w 4d         59  %  LV:        7.9  mm  CM:          5  mm  Est. FW:     902  gm           2 lb     24  % ---------------------------------------------------------------------- OB History  Gravidity:    1 ---------------------------------------------------------------------- Gestational Age  LMP:           26w 4d        Date:  07/18/21                  EDD:   04/24/22  U/S Today:     26w 2d                                        EDD:   04/26/22  Best:          26w 4d     Det. By:  LMP  (07/18/21)          EDD:   04/24/22 ---------------------------------------------------------------------- Anatomy  Cranium:               Appears normal         Aortic Arch:            Previously seen  Cavum:                 Appears normal         Ductal Arch:            Not well visualized  Ventricles:            Appears normal         Diaphragm:              Appears normal  Choroid Plexus:        Previously seen        Stomach:                Appears normal, left                                                                        sided  Cerebellum:  Appears normal         Abdomen:                Appears normal  Posterior Fossa:       Appears normal         Abdominal Wall:         Appears nml (cord                                                                        insert, abd wall)  Nuchal Fold:           Not applicable (>20    Cord Vessels:           Appears normal ([redacted]                         wks GA)                                         vessel cord)  Face:                  Appears normal         Kidneys:                Appear normal                         (orbits and profile)  Lips:                  Previously seen        Bladder:                Appears normal  Thoracic:              Appears normal         Spine:                  Appears normal  Heart:                 Appears normal         Upper Extremities:      Previously seen                         (4CH, axis, and                         situs)  RVOT:                  Appears normal         Lower Extremities:      Previously seen  LVOT:                  Appears normal  Other:  VC, 3VV and 3VTV visualized. Nasal bone, lenses, maxilla, mandible          and falx visualized. Heels/feet and open hands/5th digits previously          visualized. ---------------------------------------------------------------------- Cervix Uterus Adnexa  Cervix  Not visualized (advanced  GA >24wks)  Uterus  No abnormality visualized.  Right Ovary  Not visualized.  Left Ovary  Not visualized. ---------------------------------------------------------------------- Impression  Patient returned for completion of fetal anatomy .Amniotic  fluid is normal and good fetal activity is seen .Fetal biometry  is consistent with her previously-established dates .Fetal  anatomical survey was completed and appears normal.  Patient is 4\' 10"  tall. ---------------------------------------------------------------------- Recommendations  -An appointment was made for her to return in 6 weeks for  fetal growth assessment (small mother). ----------------------------------------------------------------------                  Linda High, MD Electronically Signed Final Report   01/20/2022 09:09 am ----------------------------------------------------------------------   Assessment and Plan:  Pregnancy: G1P0 at [redacted]w[redacted]d 1. [redacted] weeks gestation of pregnancy 2. Encounter for supervision of normal first pregnancy in second trimester EFW 24% on  01/20/22, rescan ordered. Third trimester labs done today, will follow up results and manage accordingly. Counseled about Tdap, she declined for now, information given to her. Preterm labor symptoms and general obstetric precautions including but not limited to vaginal bleeding, contractions, leaking of fluid and fetal movement were reviewed in detail with the patient. Please refer to After Visit Summary for other counseling recommendations.   Return in about 3 weeks (around 02/17/2022) for OFFICE OB VISIT (MD or APP).  Future Appointments  Date Time Provider [place]  02/17/2022  8:55 AM Darlina Rumpf, CNM [facility]  03/02/2022  8:55 AM Donnamae Jude, MD [facility]  03/04/2022  7:45 AM [facility] NURSE [facility]  03/04/2022  8:00 AM [facility] US1 [facility]  03/16/2022 10:35 AM Donnamae Jude, MD [facility]    Verita Schneiders, MD

## 2022-01-28 ENCOUNTER — Encounter: Payer: Self-pay | Admitting: Obstetrics & Gynecology

## 2022-01-28 DIAGNOSIS — O99019 Anemia complicating pregnancy, unspecified trimester: Secondary | ICD-10-CM | POA: Insufficient documentation

## 2022-01-28 DIAGNOSIS — D509 Iron deficiency anemia, unspecified: Secondary | ICD-10-CM | POA: Insufficient documentation

## 2022-01-28 DIAGNOSIS — O99013 Anemia complicating pregnancy, third trimester: Secondary | ICD-10-CM | POA: Insufficient documentation

## 2022-01-28 LAB — GLUCOSE TOLERANCE, 2 HOURS W/ 1HR
Glucose, 1 hour: 106 mg/dL (ref 70–179)
Glucose, 2 hour: 68 mg/dL — ABNORMAL LOW (ref 70–152)
Glucose, Fasting: 79 mg/dL (ref 70–91)

## 2022-01-28 LAB — RPR: RPR Ser Ql: NONREACTIVE

## 2022-01-28 LAB — CBC
Hematocrit: 30.7 % — ABNORMAL LOW (ref 34.0–46.6)
Hemoglobin: 10.2 g/dL — ABNORMAL LOW (ref 11.1–15.9)
MCH: 30.6 pg (ref 26.6–33.0)
MCHC: 33.2 g/dL (ref 31.5–35.7)
MCV: 92 fL (ref 79–97)
Platelets: 159 10*3/uL (ref 150–450)
RBC: 3.33 x10E6/uL — ABNORMAL LOW (ref 3.77–5.28)
RDW: 12.6 % (ref 11.7–15.4)
WBC: 9.3 10*3/uL (ref 3.4–10.8)

## 2022-01-28 LAB — HIV ANTIBODY (ROUTINE TESTING W REFLEX): HIV Screen 4th Generation wRfx: NONREACTIVE

## 2022-01-28 MED ORDER — FERROUS SULFATE 325 (65 FE) MG PO TABS: 325.0000 mg | ORAL_TABLET | ORAL | 3 refills | Status: DC

## 2022-02-17 ENCOUNTER — Ambulatory Visit (INDEPENDENT_AMBULATORY_CARE_PROVIDER_SITE_OTHER): Admitting: Advanced Practice Midwife

## 2022-02-17 VITALS — BP 118/77 | HR 118 | Wt 164.0 lb

## 2022-02-17 DIAGNOSIS — R6252 Short stature (child): Secondary | ICD-10-CM

## 2022-02-17 DIAGNOSIS — O99013 Anemia complicating pregnancy, third trimester: Secondary | ICD-10-CM

## 2022-02-17 DIAGNOSIS — Z23 Encounter for immunization: Secondary | ICD-10-CM

## 2022-02-17 DIAGNOSIS — Z3A30 30 weeks gestation of pregnancy: Secondary | ICD-10-CM

## 2022-02-17 DIAGNOSIS — Z3403 Encounter for supervision of normal first pregnancy, third trimester: Secondary | ICD-10-CM

## 2022-02-17 DIAGNOSIS — Z7185 Encounter for immunization safety counseling: Secondary | ICD-10-CM

## 2022-02-17 NOTE — Patient Instructions (Signed)
[email or web address]

## 2022-02-17 NOTE — Progress Notes (Signed)
ROB   Pt wants to discuss RSV vaccine.

## 2022-02-17 NOTE — Progress Notes (Signed)
PRENATAL VISIT NOTE  Subjective:  Linda Kidd is a 26 y.o. G1P0 at [redacted]w[redacted]d being seen today for ongoing prenatal care.  She is currently monitored for the following issues for this low-risk pregnancy and has Axillary hidradenitis suppurativa; Encounter for supervision of normal first pregnancy in second trimester; and Anemia affecting pregnancy in third trimester on their problem list.  Patient reports no complaints. She is taking iron supplements every other day as prescribed. Currently without GI distress.  Contractions: Not present. Vag. Bleeding: None.  Movement: Present. Denies leaking of fluid.   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 Has questions about RSV vaccine in pregnancy.  Patient's husband is scheduled to return from [place] for 3 month paternity leave in mid-February!!  The following portions of the patient's history were reviewed and updated as appropriate: allergies, current medications, past family history, past medical history, past social history, past surgical history and problem list. Problem list updated.  Objective:   Vitals:   02/17/22 0907  BP: 118/77  Pulse: (!) 118  Weight: 164 lb (74.4 kg)    Fetal Status: Fetal Heart Rate (bpm): 155   Movement: Present     General:  Alert, oriented and cooperative. Patient is in no acute distress.  Skin: Skin is warm and dry. No rash noted.   Cardiovascular: Normal heart rate noted  Respiratory: Normal respiratory effort, no problems with respiration noted  Abdomen: Soft, gravid, appropriate for gestational age.  Pain/Pressure: Present     Pelvic: Cervical exam deferred        Extremities: Normal range of motion.  Edema: None  Mental Status: Normal mood and affect. Normal behavior. Normal judgment and thought content.   Assessment and Plan:  Pregnancy: G1P0 at [redacted]w[redacted]d  1. Encounter for supervision of normal first pregnancy in third trimester - LROB, doing so well!!  2. Need for  Tdap vaccination  - Tdap vaccine greater than or equal to 7yo IM  3. Flu vaccine need  - Flu Vaccine QUAD 36mo+IM (Fluarix, Fluzone & Alfiuria Quad PF)  4. [redacted] weeks gestation of pregnancy - Initiate daily kick counts, summarized in AVS - Reviewed interventions for low kick number, indications for evaluation in MAU  5. Vaccine counseling - Yes! RSV vaccine advised for all pregnant patients at 32-36 weeks [facility] system working on obtaining vaccine, hope to have in place for patient soon  6. Anemia affecting pregnancy in third trimester - Doing well on PO Fe, asymptomatic - Will recheck iron and Ferritin next visit ~ 32 weeks  7. 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 Please refer to After Visit Summary for other counseling recommendations.  Return in about 2 weeks (around 03/03/2022) for MD or APP.  Future Appointments  Date Time Provider Department Center  03/02/2022  8:55 AM Reva Bores, MD [facility]  03/04/2022  7:45 AM [facility] NURSE [facility]  03/04/2022  8:00 AM [facility] US1 [facility]  03/16/2022 10:35 AM Reva Bores, MD [facility]  03/29/2022  9:35 AM Elgin Bing, MD [facility]  04/06/2022  8:35 AM Reva Bores, MD [facility]  04/13/2022  9:35 AM Reva Bores, MD [facility]  [facility]  04/20/2022  9:35 AM Reva Bores, MD [facility]    Calvert Cantor, CNM

## 2022-03-02 ENCOUNTER — Ambulatory Visit (INDEPENDENT_AMBULATORY_CARE_PROVIDER_SITE_OTHER): Admitting: Family Medicine

## 2022-03-02 VITALS — BP 118/75 | HR 121 | Wt 169.0 lb

## 2022-03-02 DIAGNOSIS — Z3402 Encounter for supervision of normal first pregnancy, second trimester: Secondary | ICD-10-CM

## 2022-03-02 DIAGNOSIS — Z3A32 32 weeks gestation of pregnancy: Secondary | ICD-10-CM

## 2022-03-02 DIAGNOSIS — O99013 Anemia complicating pregnancy, third trimester: Secondary | ICD-10-CM

## 2022-03-02 NOTE — Progress Notes (Signed)
   PRENATAL VISIT NOTE  Subjective:  Linda Kidd is a 27 y.o. G1P0 at [redacted]w[redacted]d being seen today for ongoing prenatal care.  She is currently monitored for the following issues for this low-risk pregnancy and has Axillary hidradenitis suppurativa; Encounter for supervision of normal first pregnancy in second trimester; and Anemia affecting pregnancy in third trimester on their problem list.  Patient reports no complaints.  Contractions: Irritability. Vag. Bleeding: None.  Movement: Present. Denies leaking of fluid.   The following portions of the patient's history were reviewed and updated as appropriate: allergies, current medications, past family history, past medical history, past social history, past surgical history and problem list.   Objective:   Vitals:   03/02/22 0905  BP: 118/75  Pulse: (!) 121  Weight: 169 lb (76.7 kg)    Fetal Status: Fetal Heart Rate (bpm): 160 Fundal Height: 32 cm Movement: Present     General:  Alert, oriented and cooperative. Patient is in no acute distress.  Skin: Skin is warm and dry. No rash noted.   Cardiovascular: Normal heart rate noted  Respiratory: Normal respiratory effort, no problems with respiration noted  Abdomen: Soft, gravid, appropriate for gestational age.  Pain/Pressure: Present     Pelvic: Cervical exam deferred        Extremities: Normal range of motion.  Edema: None  Mental Status: Normal mood and affect. Normal behavior. Normal judgment and thought content.   Assessment and Plan:  Pregnancy: G1P0 at [redacted]w[redacted]d 1. Encounter for supervision of normal first pregnancy in second trimester Continue routine prenatal care.  2. Anemia affecting pregnancy in third trimester Repeat her iron and hgb today She has post medication emesis frequently--may consider IV infusion if not improved. - CBC - Ferritin  Preterm labor symptoms and general obstetric precautions including but not limited to vaginal bleeding, contractions, leaking of  fluid and fetal movement were reviewed in detail with the patient. Please refer to After Visit Summary for other counseling recommendations.   Return in 2 weeks (on 03/16/2022).  Future Appointments  Date Time Provider [place]  03/04/2022  7:45 AM [facility] NURSE [facility]  03/04/2022  8:00 AM [facility] US1 [facility]  03/16/2022 10:35 AM Donnamae Jude, MD [facility]  03/29/2022  9:35 AM Aletha Halim, MD [facility]  04/06/2022  8:35 AM Donnamae Jude, MD [facility]  04/13/2022  9:35 AM Donnamae Jude, MD [facility]  04/20/2022  9:35 AM Donnamae Jude, MD [facility]    Donnamae Jude, MD

## 2022-03-02 NOTE — Progress Notes (Signed)
ROB 32wks  MG:NOIBBCWU with Iron Rx.

## 2022-03-03 LAB — CBC
Hematocrit: 30.4 % — ABNORMAL LOW (ref 34.0–46.6)
Hemoglobin: 10.2 g/dL — ABNORMAL LOW (ref 11.1–15.9)
MCH: 30.4 pg (ref 26.6–33.0)
MCHC: 33.6 g/dL (ref 31.5–35.7)
MCV: 91 fL (ref 79–97)
Platelets: 175 10*3/uL (ref 150–450)
RBC: 3.36 x10E6/uL — ABNORMAL LOW (ref 3.77–5.28)
RDW: 12.9 % (ref 11.7–15.4)
WBC: 9.4 10*3/uL (ref 3.4–10.8)

## 2022-03-03 LAB — FERRITIN: Ferritin: 17 ng/mL (ref 15–150)

## 2022-03-04 ENCOUNTER — Ambulatory Visit: Admitting: *Deleted

## 2022-03-04 ENCOUNTER — Ambulatory Visit: Attending: Obstetrics and Gynecology

## 2022-03-04 VITALS — BP 113/73 | HR 119

## 2022-03-04 DIAGNOSIS — O99213 Obesity complicating pregnancy, third trimester: Secondary | ICD-10-CM | POA: Diagnosis present

## 2022-03-04 DIAGNOSIS — E669 Obesity, unspecified: Secondary | ICD-10-CM

## 2022-03-04 DIAGNOSIS — Z3689 Encounter for other specified antenatal screening: Secondary | ICD-10-CM | POA: Insufficient documentation

## 2022-03-04 DIAGNOSIS — Z3A32 32 weeks gestation of pregnancy: Secondary | ICD-10-CM | POA: Diagnosis not present

## 2022-03-16 ENCOUNTER — Ambulatory Visit (INDEPENDENT_AMBULATORY_CARE_PROVIDER_SITE_OTHER): Admitting: Family Medicine

## 2022-03-16 VITALS — BP 120/74 | HR 128 | Wt 171.0 lb

## 2022-03-16 DIAGNOSIS — Z3402 Encounter for supervision of normal first pregnancy, second trimester: Secondary | ICD-10-CM

## 2022-03-16 DIAGNOSIS — Z3A34 34 weeks gestation of pregnancy: Secondary | ICD-10-CM

## 2022-03-16 NOTE — Progress Notes (Signed)
ROB [redacted]w[redacted]d  CC: None

## 2022-03-16 NOTE — Progress Notes (Signed)
   PRENATAL VISIT NOTE  Subjective:  Linda Kidd is a 27 y.o. G1P0 at [redacted]w[redacted]d being seen today for ongoing prenatal care.  She is currently monitored for the following issues for this low-risk pregnancy and has Axillary hidradenitis suppurativa; Encounter for supervision of normal first pregnancy in second trimester; and Anemia affecting pregnancy in third trimester on their problem list.  Patient reports no complaints.  Contractions: Irritability. Vag. Bleeding: None.  Movement: Present. Denies leaking of fluid.   The following portions of the patient's history were reviewed and updated as appropriate: allergies, current medications, past family history, past medical history, past social history, past surgical history and problem list.   Objective:   Vitals:   03/16/22 1044  BP: 120/74  Pulse: (!) 128  Weight: 171 lb (77.6 kg)    Fetal Status: Fetal Heart Rate (bpm): 152 Fundal Height: 34 cm Movement: Present  Presentation: Vertex  General:  Alert, oriented and cooperative. Patient is in no acute distress.  Skin: Skin is warm and dry. No rash noted.   Cardiovascular: Normal heart rate noted  Respiratory: Normal respiratory effort, no problems with respiration noted  Abdomen: Soft, gravid, appropriate for gestational age.  Pain/Pressure: Present     Pelvic: Cervical exam deferred        Extremities: Normal range of motion.  Edema: Mild pitting, slight indentation  Mental Status: Normal mood and affect. Normal behavior. Normal judgment and thought content.   Assessment and Plan:  Pregnancy: G1P0 at [redacted]w[redacted]d 1. Encounter for supervision of normal first pregnancy in second trimester Continue routine prenatal care. Cultures next week  Preterm labor symptoms and general obstetric precautions including but not limited to vaginal bleeding, contractions, leaking of fluid and fetal movement were reviewed in detail with the patient. Please refer to After Visit Summary for other counseling  recommendations.   Return in 2 weeks (on 03/30/2022).  Future Appointments  Date Time Provider [place]  03/29/2022  9:35 AM Aletha Halim, MD [facility]  04/06/2022  8:35 AM Donnamae Jude, MD [facility]  04/13/2022  9:35 AM Donnamae Jude, MD [facility]  04/20/2022  9:35 AM Donnamae Jude, MD [facility]    Donnamae Jude, MD

## 2022-03-29 ENCOUNTER — Other Ambulatory Visit (HOSPITAL_COMMUNITY)
Admission: RE | Admit: 2022-03-29 | Discharge: 2022-03-29 | Disposition: A | Discharge status: Home / Self Care | Source: Ambulatory Visit | Attending: Obstetrics and Gynecology | Admitting: Obstetrics and Gynecology

## 2022-03-29 ENCOUNTER — Encounter: Payer: Self-pay | Admitting: Obstetrics and Gynecology

## 2022-03-29 ENCOUNTER — Ambulatory Visit (INDEPENDENT_AMBULATORY_CARE_PROVIDER_SITE_OTHER): Admitting: Obstetrics and Gynecology

## 2022-03-29 VITALS — BP 123/81 | HR 114 | Wt 175.0 lb

## 2022-03-29 DIAGNOSIS — Z3402 Encounter for supervision of normal first pregnancy, second trimester: Secondary | ICD-10-CM

## 2022-03-29 DIAGNOSIS — Z3403 Encounter for supervision of normal first pregnancy, third trimester: Secondary | ICD-10-CM

## 2022-03-29 DIAGNOSIS — Z3A36 36 weeks gestation of pregnancy: Secondary | ICD-10-CM

## 2022-03-29 LAB — OB RESULTS CONSOLE GC/CHLAMYDIA
Chlamydia: NEGATIVE
Neisseria Gonorrhea: NEGATIVE

## 2022-03-29 LAB — OB RESULTS CONSOLE GBS: GBS: NEGATIVE

## 2022-03-29 NOTE — Progress Notes (Signed)
ROB   GBS today.   Pt would like Cervix Exam today

## 2022-03-29 NOTE — Progress Notes (Signed)
   PRENATAL VISIT NOTE  Subjective:  Linda Kidd is a 27 y.o. G1P0 at [redacted]w[redacted]d being seen today for ongoing prenatal care.  She is currently monitored for the following issues for this low-risk pregnancy and has Axillary hidradenitis suppurativa; Encounter for supervision of normal first pregnancy in second trimester; and Anemia affecting pregnancy in third trimester on their problem list.  Patient reports b/l LE edema.  Contractions: Irritability. Vag. Bleeding: None.  Movement: Present. Denies leaking of fluid.   The following portions of the patient's history were reviewed and updated as appropriate: allergies, current medications, past family history, past medical history, past social history, past surgical history and problem list.   Objective:   Vitals:   03/29/22 0940  BP: 123/81  Pulse: (!) 114  Weight: 175 lb (79.4 kg)    Fetal Status: Fetal Heart Rate (bpm): 140 Fundal Height: 36 cm Movement: Present  Presentation: Vertex  General:  Alert, oriented and cooperative. Patient is in no acute distress.  Skin: Skin is warm and dry. No rash noted.   Cardiovascular: Normal heart rate noted  Respiratory: Normal respiratory effort, no problems with respiration noted  Abdomen: Soft, gravid, appropriate for gestational age.  Pain/Pressure: Present     Pelvic: Cervical exam deferred Dilation: Closed Effacement (%): 0    Extremities: Normal range of motion.  Edema: Deep pitting, indentation remains for a short time  Mental Status: Normal mood and affect. Normal behavior. Normal judgment and thought content.   Assessment and Plan:  Pregnancy: G1P0 at [redacted]w[redacted]d 1. Encounter for supervision of normal first pregnancy in second trimester DVT precautions but LE normal. Recommend compression stockings, TED hoses - Cervicovaginal ancillary only - Strep Gp B NAA  2. [redacted] weeks gestation of pregnancy OCPs  Preterm labor symptoms and general obstetric precautions including but not limited to  vaginal bleeding, contractions, leaking of fluid and fetal movement were reviewed in detail with the patient. Please refer to After Visit Summary for other counseling recommendations.   Return in about 1 week (around 04/05/2022) for 7-10d, in person, low risk ob, md or app.  Future Appointments  Date Time Provider Department Center  04/06/2022  8:35 AM Donnamae Jude, MD [facility]  04/13/2022  9:35 AM Donnamae Jude, MD [facility]  04/20/2022  9:35 AM Donnamae Jude, MD [facility]  04/27/2022  9:35 AM Donnamae Jude, MD [facility]    Aletha Halim, MD

## 2022-03-30 LAB — CERVICOVAGINAL ANCILLARY ONLY
Chlamydia: NEGATIVE
Comment: NEGATIVE
Comment: NORMAL
Neisseria Gonorrhea: NEGATIVE

## 2022-03-31 LAB — STREP GP B NAA: Strep Gp B NAA: NEGATIVE

## 2022-04-06 ENCOUNTER — Other Ambulatory Visit: Payer: Self-pay | Admitting: Advanced Practice Midwife

## 2022-04-06 ENCOUNTER — Ambulatory Visit (INDEPENDENT_AMBULATORY_CARE_PROVIDER_SITE_OTHER): Admitting: Family Medicine

## 2022-04-06 ENCOUNTER — Encounter: Payer: Self-pay | Admitting: Family Medicine

## 2022-04-06 VITALS — BP 137/79 | HR 115 | Wt 181.0 lb

## 2022-04-06 DIAGNOSIS — O321XX0 Maternal care for breech presentation, not applicable or unspecified: Secondary | ICD-10-CM | POA: Insufficient documentation

## 2022-04-06 DIAGNOSIS — Z3A37 37 weeks gestation of pregnancy: Secondary | ICD-10-CM | POA: Diagnosis not present

## 2022-04-06 DIAGNOSIS — O321XX1 Maternal care for breech presentation, fetus 1: Secondary | ICD-10-CM | POA: Diagnosis not present

## 2022-04-06 DIAGNOSIS — Z3402 Encounter for supervision of normal first pregnancy, second trimester: Secondary | ICD-10-CM

## 2022-04-06 NOTE — Addendum Note (Signed)
Addended by: Donnamae Jude on: 04/06/2022 11:03 AM   Modules accepted: Orders

## 2022-04-06 NOTE — Progress Notes (Addendum)
   PRENATAL VISIT NOTE  Subjective:  Linda Kidd is a 27 y.o. G1P0 at [redacted]w[redacted]d being seen today for ongoing prenatal care.  She is currently monitored for the following issues for this low-risk pregnancy and has Axillary hidradenitis suppurativa; Encounter for supervision of normal first pregnancy in second trimester; Anemia affecting pregnancy in third trimester; and Breech presentation of fetus on their problem list.  Patient reports  LE swelling .  Contractions: Irregular. Vag. Bleeding: None.  Movement: Present. Denies leaking of fluid.   The following portions of the patient's history were reviewed and updated as appropriate: allergies, current medications, past family history, past medical history, past social history, past surgical history and problem list.   Objective:   Vitals:   04/06/22 0841  BP: 137/79  Pulse: (!) 115  Weight: 181 lb (82.1 kg)    Fetal Status: Fetal Heart Rate (bpm): 149 Fundal Height: 36 cm Movement: Present  Presentation: Pilar Plate Breech  General:  Alert, oriented and cooperative. Patient is in no acute distress.  Skin: Skin is warm and dry. No rash noted.   Cardiovascular: Normal heart rate noted  Respiratory: Normal respiratory effort, no problems with respiration noted  Abdomen: Soft, gravid, appropriate for gestational age.  Pain/Pressure: Present     Pelvic: Cervical exam performed in the presence of a chaperone Dilation: 1 Effacement (%): 70 Station: Ballotable  Extremities: Normal range of motion.  Edema: Moderate pitting, indentation subsides rapidly  Mental Status: Normal mood and affect. Normal behavior. Normal judgment and thought content.   Bedside u/s reveals frank breech  Assessment and Plan:  Pregnancy: G1P0 at [redacted]w[redacted]d 1. Encounter for supervision of normal first pregnancy in third trimester GBS negative Gained 6 lbs this week. Watch BP.  2. Breech presentation, single or unspecified fetus Found on Leopold's and confirmed with bedside  u/s Discussed options of primary c-section, ECV, moxibustion. To discuss with family and let us know how she would like to proceed. Called back and wants attempt at ECV--scheduled and orders placed  Preterm labor symptoms and general obstetric precautions including but not limited to vaginal bleeding, contractions, leaking of fluid and fetal movement were reviewed in detail with the patient. Please refer to After Visit Summary for other counseling recommendations.   Return in 1 week (on 04/13/2022).  Future Appointments  Date Time Provider [place]  04/13/2022  9:35 AM Donnamae Jude, MD [facility]  04/20/2022  9:35 AM Donnamae Jude, MD [facility]  04/27/2022  9:35 AM Donnamae Jude, MD [facility]    Donnamae Jude, MD

## 2022-04-06 NOTE — Progress Notes (Signed)
CC: Routine OB  Discuss edema lower extremities

## 2022-04-08 ENCOUNTER — Encounter (HOSPITAL_COMMUNITY): Payer: Self-pay | Admitting: *Deleted

## 2022-04-08 ENCOUNTER — Telehealth (HOSPITAL_COMMUNITY): Payer: Self-pay | Admitting: *Deleted

## 2022-04-08 NOTE — Telephone Encounter (Signed)
Preadmission screen  

## 2022-04-09 ENCOUNTER — Observation Stay (HOSPITAL_COMMUNITY)

## 2022-04-09 ENCOUNTER — Ambulatory Visit (HOSPITAL_COMMUNITY)
Admission: RE | Admit: 2022-04-09 | Discharge: 2022-04-09 | Disposition: A | Discharge status: Home / Self Care | Attending: Obstetrics and Gynecology | Admitting: Obstetrics and Gynecology

## 2022-04-09 ENCOUNTER — Encounter (HOSPITAL_COMMUNITY): Payer: Self-pay | Admitting: Family Medicine

## 2022-04-09 ENCOUNTER — Other Ambulatory Visit: Payer: Self-pay

## 2022-04-09 DIAGNOSIS — O321XX0 Maternal care for breech presentation, not applicable or unspecified: Secondary | ICD-10-CM | POA: Insufficient documentation

## 2022-04-09 DIAGNOSIS — Z3A37 37 weeks gestation of pregnancy: Secondary | ICD-10-CM | POA: Diagnosis not present

## 2022-04-09 MED ORDER — LACTATED RINGERS IV SOLN: INTRAVENOUS | Status: DC

## 2022-04-09 MED ORDER — TERBUTALINE SULFATE 1 MG/ML IJ SOLN: 0.2500 mg | Freq: Once | INTRAMUSCULAR | Status: DC

## 2022-04-09 NOTE — H&P (Signed)
Linda Kidd is an 27 y.o. G38P0 72w6dfemale.   Chief Complaint: breech presentation HPI: here for attempt at ECV  [facility] [place]  OB History     Gravida  1   Para      Term      Preterm      AB      Living         SAB      IAB      Ectopic      Multiple      Live Births              Past Medical History:  Diagnosis Date   Medical history non-contributory     Past Surgical History:  Procedure Laterality Date   NO PAST SURGERIES      Family History  Problem Relation Age of Onset   Cervical cancer Mother    Diabetes Father    Social History:  reports that she has never smoked. She has never used smokeless tobacco. She reports current alcohol use of about 1.0 standard drink of alcohol per week. She reports that she does not use drugs.    Allergies  Allergen Reactions   Acetaminophen-Caffeine Other (See Comments)    Feels like acid reflux   Excedrin Back & [Acetaminophen-Aspirin Buffered] Other (See Comments)    Gi burning    Medications Prior to Admission  Medication Sig Dispense Refill   ferrous sulfate (FERROUSUL) 325 (65 FE) MG tablet Take 1 tablet (325 mg total) by mouth every other day. 30 tablet 3   Prenatal Vit-Fe Fumarate-FA (MULTIVITAMIN-PRENATAL) 27-0.8 MG TABS tablet Take 1 tablet by mouth daily at 12 noon.       A comprehensive review of systems was negative.  Objective: Blood pressure 119/73, pulse (!) 118, temperature 98.3 F (36.8 C), temperature source Oral, resp. rate 17, last menstrual period 07/18/2021, SpO2 98 %. General appearance: alert, cooperative, and appears stated age Head: Normocephalic, without obvious abnormality, atraumatic Lungs:  normal effort Heart: regular rate and rhythm Abdomen:  gravid, non-tender Extremities: Homans sign is negative, no sign of DVT Skin: Skin color, texture, turgor normal. No rashes or lesions   Labs: No results found for this or any previous visit (from the past 24 hour(s)).           ABO, Rh: A/Positive/-- (07/17 0000)  Antibody: Negative (07/17 0000)  Rubella: Immune (07/17 0000)  RPR: Non Reactive (12/07 0914)  HBsAg: Negative (07/17 0000)  HIV: Non Reactive (12/07 0914)  GBS: Negative/-- (02/06 1120)    Radiology studies: No results found.  Prenatal Transfer Tool  Maternal Diabetes: No Genetic Screening: Declined Maternal Ultrasounds/Referrals: Normal Fetal Ultrasounds or other Referrals:  None Maternal Substance Abuse:  No Significant Maternal Medications:  None Significant Maternal Lab Results: Group B Strep negative  Assessment/Plan Active Problems:   Breech presentation of fetus   For attempt at ECV. Reviewed R/B/A. Agrees to proceed. Will do w/ u/s guidance and after terbutaline. May have epidural if desires.  TDonnamae Jude2/17/2024, 8:25 AM

## 2022-04-09 NOTE — Discharge Summary (Signed)
Obstetrics Discharge Summary Date of Admission: 04/09/2022 at 37/6 weeks Date of Discharge: 04/09/2022  The patient was admitted, as scheduled, for an ECV. Patient confirmed breech on admit ultrasound. Prior to the procedure taking place, she changed her mind. I talked with her regarding risks, benefits, and alternatives to ECV and she still elected for a c-section. Since she is low risk, I told her that her c-section would be scheduled at 39wks, which she was amenable to; she was scheduled for Feb 25th.   Of note, patient was a very hard IV stick and needed the ultrasound and her blood was not able to be drawn yet.  Fetal status reassuring with normal fetal heart tones. Ultrasound just prior to discharge showed head in LUQ, back down, normal fetal heart tones, AFI subjectively wnl and great fetal movements.     Allergies as of 04/09/2022       Reactions   Acetaminophen-caffeine Other (See Comments)   Feels like acid reflux   Excedrin Back & [acetaminophen-aspirin Buffered] Other (See Comments)   Gi burning        Medication List     TAKE these medications    ferrous sulfate 325 (65 FE) MG tablet Commonly known as: FerrouSul Take 1 tablet (325 mg total) by mouth every other day.   multivitamin-prenatal 27-0.8 MG Tabs tablet Take 1 tablet by mouth daily at 12 noon.        Future Appointments  Date Time Provider [place]  04/13/2022  9:35 AM Donnamae Jude, MD [facility]  04/20/2022  9:35 AM Donnamae Jude, MD [facility]  04/27/2022  9:35 AM Donnamae Jude, MD [facility]    Durene Romans MD Attending Center for [facility])

## 2022-04-11 ENCOUNTER — Encounter (HOSPITAL_COMMUNITY): Payer: Self-pay

## 2022-04-11 NOTE — Patient Instructions (Signed)
Graylee Parke Kuhnle  04/11/2022   Your procedure is scheduled on:  04/17/2022  Arrive at 67 at Entrance C on [address] at [facility]  and [address]. You are invited to use the FREE valet parking or use the Visitor's parking deck.  Pick up the phone at the desk and dial [phone].  Call this number if you have problems the morning of surgery: [phone]  Remember:   Do not eat food:(After Midnight) Desps de medianoche.  Do not drink clear liquids: (After Midnight) Desps de medianoche.  Take these medicines the morning of surgery with A SIP OF WATER:  none   Do not wear jewelry, make-up or nail polish.  Do not wear lotions, powders, or perfumes. Do not wear deodorant.  Do not shave 48 hours prior to surgery.  Do not bring valuables to the hospital.  [facility] is not   responsible for any belongings or valuables brought to the hospital.  Contacts, dentures or bridgework may not be worn into surgery.  Leave suitcase in the car. After surgery it may be brought to your room.  For patients admitted to the hospital, checkout time is 11:00 AM the day of              discharge.      Please read over the following fact sheets that you were given:     Preparing for Surgery

## 2022-04-13 ENCOUNTER — Ambulatory Visit (INDEPENDENT_AMBULATORY_CARE_PROVIDER_SITE_OTHER): Admitting: Family Medicine

## 2022-04-13 VITALS — BP 118/78 | HR 120 | Wt 182.0 lb

## 2022-04-13 DIAGNOSIS — O321XX0 Maternal care for breech presentation, not applicable or unspecified: Secondary | ICD-10-CM

## 2022-04-13 DIAGNOSIS — Z3402 Encounter for supervision of normal first pregnancy, second trimester: Secondary | ICD-10-CM

## 2022-04-13 DIAGNOSIS — Z3A38 38 weeks gestation of pregnancy: Secondary | ICD-10-CM

## 2022-04-13 NOTE — Progress Notes (Signed)
   PRENATAL VISIT NOTE  Subjective:  Linda Kidd is a 27 y.o. G1P0 at 31w3dbeing seen today for ongoing prenatal care.  She is currently monitored for the following issues for this low-risk pregnancy and has Axillary hidradenitis suppurativa; Encounter for supervision of normal first pregnancy in second trimester; Anemia affecting pregnancy in third trimester; Breech presentation of fetus; and Breech presentation on their problem list.  Patient reports  LE swelling .  Contractions: Irregular. Vag. Bleeding: None.  Movement: Present. Denies leaking of fluid.   The following portions of the patient's history were reviewed and updated as appropriate: allergies, current medications, past family history, past medical history, past social history, past surgical history and problem list.   Objective:   Vitals:   04/13/22 0956  BP: 118/78  Pulse: (!) 120  Weight: 182 lb (82.6 kg)    Fetal Status: Fetal Heart Rate (bpm): 149   Movement: Present     General:  Alert, oriented and cooperative. Patient is in no acute distress.  Skin: Skin is warm and dry. No rash noted.   Cardiovascular: Normal heart rate noted  Respiratory: Normal respiratory effort, no problems with respiration noted  Abdomen: Soft, gravid, appropriate for gestational age.  Pain/Pressure: Present     Pelvic: Cervical exam deferred        Extremities: Normal range of motion.  Edema: Mild pitting, slight indentation  Mental Status: Normal mood and affect. Normal behavior. Normal judgment and thought content.   Assessment and Plan:  Pregnancy: G1P0 at 337w3d. Breech presentation, single or unspecified fetus For PLTCS--discussed usual timing, hospital course, etc.   2. Encounter for supervision of normal first pregnancy in third trimester   Term labor symptoms and general obstetric precautions including but not limited to vaginal bleeding, contractions, leaking of fluid and fetal movement were reviewed in detail with  the patient. Please refer to After Visit Summary for other counseling recommendations.   No follow-ups on file.  Future Appointments  Date Time Provider [place]2/23/2024 10:30 AM [facility] PAT 1 [facility] None    TaDonnamae JudeMD

## 2022-04-13 NOTE — Progress Notes (Signed)
CC: Swelling getting worse in the left foot

## 2022-04-15 ENCOUNTER — Encounter (HOSPITAL_COMMUNITY)
Admission: RE | Admit: 2022-04-15 | Discharge: 2022-04-15 | Disposition: A | Discharge status: Home / Self Care | Source: Ambulatory Visit | Attending: Family Medicine | Admitting: Family Medicine

## 2022-04-15 ENCOUNTER — Other Ambulatory Visit: Payer: Self-pay | Admitting: Obstetrics & Gynecology

## 2022-04-15 DIAGNOSIS — Z01812 Encounter for preprocedural laboratory examination: Secondary | ICD-10-CM | POA: Insufficient documentation

## 2022-04-15 DIAGNOSIS — Z01818 Encounter for other preprocedural examination: Secondary | ICD-10-CM

## 2022-04-15 LAB — CBC
HCT: 28.9 % — ABNORMAL LOW (ref 36.0–46.0)
Hemoglobin: 9.6 g/dL — ABNORMAL LOW (ref 12.0–15.0)
MCH: 28.6 pg (ref 26.0–34.0)
MCHC: 33.2 g/dL (ref 30.0–36.0)
MCV: 86 fL (ref 80.0–100.0)
Platelets: 164 10*3/uL (ref 150–400)
RBC: 3.36 MIL/uL — ABNORMAL LOW (ref 3.87–5.11)
RDW: 14.3 % (ref 11.5–15.5)
WBC: 7.5 10*3/uL (ref 4.0–10.5)
nRBC: 0 % (ref 0.0–0.2)

## 2022-04-15 LAB — RPR: RPR Ser Ql: NONREACTIVE

## 2022-04-15 LAB — TYPE AND SCREEN
ABO/RH(D): A POS
Antibody Screen: NEGATIVE

## 2022-04-15 LAB — OB RESULTS CONSOLE RPR: RPR: NONREACTIVE

## 2022-04-15 LAB — RAPID HIV SCREEN (HIV 1/2 AB+AG)
HIV 1/2 Antibodies: NONREACTIVE
HIV-1 P24 Antigen - HIV24: NONREACTIVE

## 2022-04-16 ENCOUNTER — Encounter (HOSPITAL_COMMUNITY): Payer: Self-pay | Admitting: Obstetrics and Gynecology

## 2022-04-17 ENCOUNTER — Inpatient Hospital Stay (HOSPITAL_COMMUNITY): Admitting: Anesthesiology

## 2022-04-17 ENCOUNTER — Encounter (HOSPITAL_COMMUNITY): Payer: Self-pay | Admitting: Obstetrics & Gynecology

## 2022-04-17 ENCOUNTER — Other Ambulatory Visit: Payer: Self-pay

## 2022-04-17 ENCOUNTER — Inpatient Hospital Stay (HOSPITAL_COMMUNITY)
Admission: RE | Admit: 2022-04-17 | Discharge: 2022-04-19 | DRG: 788 | Disposition: A | Discharge status: Home / Self Care | Source: Ambulatory Visit | Attending: Obstetrics & Gynecology | Admitting: Obstetrics & Gynecology

## 2022-04-17 ENCOUNTER — Encounter (HOSPITAL_COMMUNITY)
Admission: RE | Disposition: A | Discharge status: Home / Self Care | Payer: Self-pay | Source: Home / Self Care | Attending: Family Medicine

## 2022-04-17 ENCOUNTER — Encounter (HOSPITAL_COMMUNITY)
Admission: RE | Disposition: A | Discharge status: Home / Self Care | Payer: Self-pay | Source: Ambulatory Visit | Attending: Obstetrics & Gynecology

## 2022-04-17 DIAGNOSIS — O99214 Obesity complicating childbirth: Secondary | ICD-10-CM | POA: Diagnosis present

## 2022-04-17 DIAGNOSIS — Z3A39 39 weeks gestation of pregnancy: Secondary | ICD-10-CM

## 2022-04-17 DIAGNOSIS — O9902 Anemia complicating childbirth: Secondary | ICD-10-CM | POA: Diagnosis present

## 2022-04-17 DIAGNOSIS — O321XX0 Maternal care for breech presentation, not applicable or unspecified: Secondary | ICD-10-CM

## 2022-04-17 DIAGNOSIS — D509 Iron deficiency anemia, unspecified: Secondary | ICD-10-CM | POA: Diagnosis present

## 2022-04-17 DIAGNOSIS — O99019 Anemia complicating pregnancy, unspecified trimester: Secondary | ICD-10-CM | POA: Diagnosis present

## 2022-04-17 DIAGNOSIS — O99013 Anemia complicating pregnancy, third trimester: Secondary | ICD-10-CM | POA: Diagnosis present

## 2022-04-17 DIAGNOSIS — Z98891 History of uterine scar from previous surgery: Secondary | ICD-10-CM

## 2022-04-17 LAB — ABO/RH: ABO/RH(D): A POS

## 2022-04-17 SURGERY — Surgical Case
Anesthesia: Regional

## 2022-04-17 SURGERY — Surgical Case
Anesthesia: Spinal | Site: Abdomen

## 2022-04-17 MED ORDER — KETOROLAC TROMETHAMINE 30 MG/ML IJ SOLN
30.0000 mg | Freq: Four times a day (QID) | INTRAMUSCULAR | Status: AC | PRN
  Administered 2022-04-17: 30 mg via INTRAVENOUS

## 2022-04-17 MED ORDER — ACETAMINOPHEN 500 MG PO TABS
1000.0000 mg | ORAL_TABLET | Freq: Four times a day (QID) | ORAL | Status: AC
  Administered 2022-04-17 – 2022-04-18 (×2): 1000 mg via ORAL
  Filled 2022-04-17 (×2): qty 2

## 2022-04-17 MED ORDER — DEXAMETHASONE SODIUM PHOSPHATE 10 MG/ML IJ SOLN
INTRAMUSCULAR | Status: DC | PRN
  Administered 2022-04-17: 5 mg via INTRAVENOUS

## 2022-04-17 MED ORDER — ZOLPIDEM TARTRATE 5 MG PO TABS: 5.0000 mg | ORAL_TABLET | Freq: Once | ORAL | Status: DC

## 2022-04-17 MED ORDER — ONDANSETRON HCL 4 MG/2ML IJ SOLN
INTRAMUSCULAR | Status: DC | PRN
  Administered 2022-04-17: 4 mg via INTRAVENOUS

## 2022-04-17 MED ORDER — GABAPENTIN 100 MG PO CAPS
100.0000 mg | ORAL_CAPSULE | Freq: Two times a day (BID) | ORAL | Status: DC
  Administered 2022-04-17 – 2022-04-19 (×4): 100 mg via ORAL
  Filled 2022-04-17 (×4): qty 1

## 2022-04-17 MED ORDER — STERILE WATER FOR IRRIGATION IR SOLN
Status: DC | PRN
  Administered 2022-04-17: 1

## 2022-04-17 MED ORDER — ONDANSETRON HCL 4 MG/2ML IJ SOLN
INTRAMUSCULAR | Status: AC
  Filled 2022-04-17: qty 2

## 2022-04-17 MED ORDER — MEPERIDINE HCL 25 MG/ML IJ SOLN: 6.2500 mg | INTRAMUSCULAR | Status: DC | PRN

## 2022-04-17 MED ORDER — SCOPOLAMINE 1 MG/3DAYS TD PT72
MEDICATED_PATCH | TRANSDERMAL | Status: AC
  Filled 2022-04-17: qty 1

## 2022-04-17 MED ORDER — DIPHENHYDRAMINE HCL 25 MG PO CAPS: 25.0000 mg | ORAL_CAPSULE | Freq: Four times a day (QID) | ORAL | Status: DC | PRN

## 2022-04-17 MED ORDER — PHENYLEPHRINE HCL-NACL 20-0.9 MG/250ML-% IV SOLN
INTRAVENOUS | Status: DC | PRN
  Administered 2022-04-17: 60 ug/min via INTRAVENOUS

## 2022-04-17 MED ORDER — SIMETHICONE 80 MG PO CHEW: 80.0000 mg | CHEWABLE_TABLET | ORAL | Status: DC | PRN

## 2022-04-17 MED ORDER — NALOXONE HCL 4 MG/10ML IJ SOLN: 1.0000 ug/kg/h | INTRAVENOUS | Status: DC | PRN

## 2022-04-17 MED ORDER — SODIUM CHLORIDE 0.9% FLUSH: 3.0000 mL | INTRAVENOUS | Status: DC | PRN

## 2022-04-17 MED ORDER — FENTANYL CITRATE (PF) 100 MCG/2ML IJ SOLN
INTRAMUSCULAR | Status: AC
  Filled 2022-04-17: qty 2

## 2022-04-17 MED ORDER — SCOPOLAMINE 1 MG/3DAYS TD PT72
MEDICATED_PATCH | TRANSDERMAL | Status: DC | PRN
  Administered 2022-04-17: 1 via TRANSDERMAL

## 2022-04-17 MED ORDER — PROMETHAZINE HCL 25 MG/ML IJ SOLN: 6.2500 mg | INTRAMUSCULAR | Status: DC | PRN

## 2022-04-17 MED ORDER — ENOXAPARIN SODIUM 40 MG/0.4ML IJ SOSY
40.0000 mg | PREFILLED_SYRINGE | INTRAMUSCULAR | Status: DC
  Administered 2022-04-18 – 2022-04-19 (×2): 40 mg via SUBCUTANEOUS
  Filled 2022-04-17 (×2): qty 0.4

## 2022-04-17 MED ORDER — MENTHOL 3 MG MT LOZG: 1.0000 | LOZENGE | OROMUCOSAL | Status: DC | PRN

## 2022-04-17 MED ORDER — SENNOSIDES-DOCUSATE SODIUM 8.6-50 MG PO TABS
2.0000 | ORAL_TABLET | Freq: Every day | ORAL | Status: DC
  Administered 2022-04-18 – 2022-04-19 (×2): 2 via ORAL
  Filled 2022-04-17 (×2): qty 2

## 2022-04-17 MED ORDER — LACTATED RINGERS IV SOLN: INTRAVENOUS | Status: DC | PRN

## 2022-04-17 MED ORDER — ZOLPIDEM TARTRATE 5 MG PO TABS: 5.0000 mg | ORAL_TABLET | Freq: Every evening | ORAL | Status: DC | PRN

## 2022-04-17 MED ORDER — FENTANYL CITRATE (PF) 100 MCG/2ML IJ SOLN
INTRAMUSCULAR | Status: DC | PRN
  Administered 2022-04-17: 15 ug via INTRATHECAL

## 2022-04-17 MED ORDER — BUPIVACAINE IN DEXTROSE 0.75-8.25 % IT SOLN
INTRATHECAL | Status: DC | PRN
  Administered 2022-04-17: 1.3 mL via INTRATHECAL

## 2022-04-17 MED ORDER — NALOXONE HCL 0.4 MG/ML IJ SOLN: 0.4000 mg | INTRAMUSCULAR | Status: DC | PRN

## 2022-04-17 MED ORDER — PHENYLEPHRINE HCL (PRESSORS) 10 MG/ML IV SOLN
INTRAVENOUS | Status: DC | PRN
  Administered 2022-04-17 (×2): 80 ug via INTRAVENOUS

## 2022-04-17 MED ORDER — POVIDONE-IODINE 10 % EX SWAB
2.0000 | Freq: Once | CUTANEOUS | Status: AC
  Administered 2022-04-17: 2 via TOPICAL

## 2022-04-17 MED ORDER — SIMETHICONE 80 MG PO CHEW
80.0000 mg | CHEWABLE_TABLET | Freq: Three times a day (TID) | ORAL | Status: DC
  Administered 2022-04-18 – 2022-04-19 (×3): 80 mg via ORAL
  Filled 2022-04-17 (×3): qty 1

## 2022-04-17 MED ORDER — PHENYLEPHRINE HCL-NACL 20-0.9 MG/250ML-% IV SOLN
INTRAVENOUS | Status: AC
  Filled 2022-04-17: qty 250

## 2022-04-17 MED ORDER — KETOROLAC TROMETHAMINE 30 MG/ML IJ SOLN
INTRAMUSCULAR | Status: AC
  Filled 2022-04-17: qty 1

## 2022-04-17 MED ORDER — CEFAZOLIN SODIUM-DEXTROSE 2-4 GM/100ML-% IV SOLN
INTRAVENOUS | Status: AC
  Filled 2022-04-17: qty 100

## 2022-04-17 MED ORDER — IBUPROFEN 600 MG PO TABS
600.0000 mg | ORAL_TABLET | Freq: Four times a day (QID) | ORAL | Status: DC
  Administered 2022-04-18 – 2022-04-19 (×5): 600 mg via ORAL
  Filled 2022-04-17 (×4): qty 1

## 2022-04-17 MED ORDER — WITCH HAZEL-GLYCERIN EX PADS: 1.0000 | MEDICATED_PAD | CUTANEOUS | Status: DC | PRN

## 2022-04-17 MED ORDER — OXYCODONE HCL 5 MG PO TABS: 5.0000 mg | ORAL_TABLET | ORAL | Status: DC | PRN

## 2022-04-17 MED ORDER — CEFAZOLIN SODIUM-DEXTROSE 2-4 GM/100ML-% IV SOLN
2.0000 g | INTRAVENOUS | Status: AC
  Administered 2022-04-17: 2 g via INTRAVENOUS

## 2022-04-17 MED ORDER — EPHEDRINE 5 MG/ML INJ
INTRAVENOUS | Status: AC
  Filled 2022-04-17: qty 5

## 2022-04-17 MED ORDER — ACETAMINOPHEN 10 MG/ML IV SOLN
INTRAVENOUS | Status: DC | PRN
  Administered 2022-04-17: 1000 mg via INTRAVENOUS

## 2022-04-17 MED ORDER — KETOROLAC TROMETHAMINE 30 MG/ML IJ SOLN
30.0000 mg | Freq: Four times a day (QID) | INTRAMUSCULAR | Status: AC
  Administered 2022-04-17 – 2022-04-18 (×2): 30 mg via INTRAVENOUS
  Filled 2022-04-17 (×2): qty 1

## 2022-04-17 MED ORDER — ACETAMINOPHEN 325 MG PO TABS: 650.0000 mg | ORAL_TABLET | ORAL | Status: DC | PRN

## 2022-04-17 MED ORDER — HYDROMORPHONE HCL 1 MG/ML IJ SOLN: 0.2500 mg | INTRAMUSCULAR | Status: DC | PRN

## 2022-04-17 MED ORDER — PRENATAL MULTIVITAMIN CH
1.0000 | ORAL_TABLET | Freq: Every day | ORAL | Status: DC
  Administered 2022-04-19: 1 via ORAL
  Filled 2022-04-17 (×2): qty 1

## 2022-04-17 MED ORDER — DIBUCAINE (PERIANAL) 1 % EX OINT: 1.0000 | TOPICAL_OINTMENT | CUTANEOUS | Status: DC | PRN

## 2022-04-17 MED ORDER — DIPHENHYDRAMINE HCL 25 MG PO CAPS: 25.0000 mg | ORAL_CAPSULE | ORAL | Status: DC | PRN

## 2022-04-17 MED ORDER — OXYTOCIN-SODIUM CHLORIDE 30-0.9 UT/500ML-% IV SOLN
INTRAVENOUS | Status: DC | PRN
  Administered 2022-04-17: 300 mL via INTRAVENOUS

## 2022-04-17 MED ORDER — ONDANSETRON HCL 4 MG/2ML IJ SOLN: 4.0000 mg | Freq: Three times a day (TID) | INTRAMUSCULAR | Status: DC | PRN

## 2022-04-17 MED ORDER — OXYCODONE HCL 5 MG PO TABS: 5.0000 mg | ORAL_TABLET | Freq: Four times a day (QID) | ORAL | Status: DC | PRN

## 2022-04-17 MED ORDER — PHENYLEPHRINE 80 MCG/ML (10ML) SYRINGE FOR IV PUSH (FOR BLOOD PRESSURE SUPPORT)
PREFILLED_SYRINGE | INTRAVENOUS | Status: AC
  Filled 2022-04-17: qty 10

## 2022-04-17 MED ORDER — SODIUM CHLORIDE 0.9 % IR SOLN
Status: DC | PRN
  Administered 2022-04-17: 1000 mL

## 2022-04-17 MED ORDER — COCONUT OIL OIL: 1.0000 | TOPICAL_OIL | Status: DC | PRN

## 2022-04-17 MED ORDER — KETOROLAC TROMETHAMINE 30 MG/ML IJ SOLN: 30.0000 mg | Freq: Four times a day (QID) | INTRAMUSCULAR | Status: AC | PRN

## 2022-04-17 MED ORDER — KETOROLAC TROMETHAMINE 30 MG/ML IJ SOLN: 30.0000 mg | Freq: Once | INTRAMUSCULAR | Status: DC | PRN

## 2022-04-17 MED ORDER — SCOPOLAMINE 1 MG/3DAYS TD PT72
1.0000 | MEDICATED_PATCH | Freq: Once | TRANSDERMAL | Status: DC
  Administered 2022-04-17: 1.5 mg via TRANSDERMAL

## 2022-04-17 MED ORDER — DEXAMETHASONE SODIUM PHOSPHATE 10 MG/ML IJ SOLN
INTRAMUSCULAR | Status: AC
  Filled 2022-04-17: qty 1

## 2022-04-17 MED ORDER — DIPHENHYDRAMINE HCL 50 MG/ML IJ SOLN: 12.5000 mg | INTRAMUSCULAR | Status: DC | PRN

## 2022-04-17 MED ORDER — MORPHINE SULFATE (PF) 0.5 MG/ML IJ SOLN
INTRAMUSCULAR | Status: DC | PRN
  Administered 2022-04-17: .15 mg via INTRATHECAL

## 2022-04-17 MED ORDER — MORPHINE SULFATE (PF) 0.5 MG/ML IJ SOLN
INTRAMUSCULAR | Status: AC
  Filled 2022-04-17: qty 10

## 2022-04-17 MED ORDER — OXYTOCIN-SODIUM CHLORIDE 30-0.9 UT/500ML-% IV SOLN
2.5000 [IU]/h | INTRAVENOUS | Status: AC
  Administered 2022-04-17: 2.5 [IU]/h via INTRAVENOUS
  Filled 2022-04-17: qty 500

## 2022-04-17 MED ORDER — TETANUS-DIPHTH-ACELL PERTUSSIS 5-2.5-18.5 LF-MCG/0.5 IM SUSY: 0.5000 mL | PREFILLED_SYRINGE | Freq: Once | INTRAMUSCULAR | Status: DC

## 2022-04-17 SURGICAL SUPPLY — 31 items
BENZOIN TINCTURE PRP APPL 2/3 (GAUZE/BANDAGES/DRESSINGS) IMPLANT
CHLORAPREP W/TINT 26 (MISCELLANEOUS) ×2 IMPLANT
CLAMP UMBILICAL CORD (MISCELLANEOUS) ×1 IMPLANT
CLOTH BEACON ORANGE TIMEOUT ST (SAFETY) ×1 IMPLANT
DERMABOND ADVANCED .7 DNX6 (GAUZE/BANDAGES/DRESSINGS) IMPLANT
DRSG OPSITE POSTOP 4X10 (GAUZE/BANDAGES/DRESSINGS) ×1 IMPLANT
ELECT REM PT RETURN 9FT ADLT (ELECTROSURGICAL) ×1
ELECTRODE REM PT RTRN 9FT ADLT (ELECTROSURGICAL) ×1 IMPLANT
EXTRACTOR VACUUM KIWI (MISCELLANEOUS) IMPLANT
GLOVE SURG ORTHO 8.0 STRL STRW (GLOVE) ×1 IMPLANT
GOWN STRL REUS W/TWL LRG LVL3 (GOWN DISPOSABLE) ×2 IMPLANT
KIT ABG SYR 3ML LUER SLIP (SYRINGE) IMPLANT
NDL HYPO 25X5/8 SAFETYGLIDE (NEEDLE) IMPLANT
NEEDLE HYPO 25X5/8 SAFETYGLIDE (NEEDLE) IMPLANT
NS IRRIG 1000ML POUR BTL (IV SOLUTION) ×1 IMPLANT
PACK C SECTION WH (CUSTOM PROCEDURE TRAY) ×1 IMPLANT
PAD OB MATERNITY 4.3X12.25 (PERSONAL CARE ITEMS) ×1 IMPLANT
RETAINER VISCERAL (MISCELLANEOUS) IMPLANT
RTRCTR C-SECT PINK 25CM LRG (MISCELLANEOUS) IMPLANT
STRIP CLOSURE SKIN 1/2X4 (GAUZE/BANDAGES/DRESSINGS) IMPLANT
SUT MON AB-0 CT1 36 (SUTURE) ×2 IMPLANT
SUT PLAIN 0 NONE (SUTURE) IMPLANT
SUT VIC AB 0 CT1 27 (SUTURE) ×2
SUT VIC AB 0 CT1 27XBRD ANBCTR (SUTURE) ×2 IMPLANT
SUT VIC AB 2-0 CT1 27 (SUTURE) ×1
SUT VIC AB 2-0 CT1 TAPERPNT 27 (SUTURE) ×1 IMPLANT
SUT VIC AB 4-0 SH 27 (SUTURE) ×1
SUT VIC AB 4-0 SH 27XANBCTRL (SUTURE) ×1 IMPLANT
TOWEL OR 17X24 6PK STRL BLUE (TOWEL DISPOSABLE) ×1 IMPLANT
TRAY FOLEY W/BAG SLVR 14FR LF (SET/KITS/TRAYS/PACK) ×1 IMPLANT
WATER STERILE IRR 1000ML POUR (IV SOLUTION) ×1 IMPLANT

## 2022-04-17 NOTE — Op Note (Cosign Needed Addendum)
Linda Kidd PROCEDURE DATE: 04/17/2022  PREOPERATIVE DIAGNOSES: Intrauterine pregnancy at 66w0dweeks gestation; malpresentation: breech  POSTOPERATIVE DIAGNOSES: The same  PROCEDURE: Low Transverse Cesarean Section  SURGEON:  Dr. EElonda Husky ASSISTANT:  Dr. AJanus Molder An experienced assistant was required given the standard of surgical care given the complexity of the case.  This assistant was needed for exposure, dissection, suctioning, retraction, instrument exchange, assisting with delivery with administration of fundal pressure, and for overall help during the procedure.  ANESTHESIOLOGY TEAM: Anesthesiologist: HLyn Hollingshead MD CRNA: RElenore Paddy CRNA  INDICATIONS: Linda Kidd is a 27y.o. G1P1001 at 360w0dere for cesarean section secondary to the indications listed under preoperative diagnoses; please see preoperative note for further details.  The risks of surgery were discussed with the patient including but were not limited to: bleeding which may require transfusion or reoperation; infection which may require antibiotics; injury to bowel, bladder, ureters or other surrounding organs; injury to the fetus; need for additional procedures including hysterectomy in the event of a life-threatening hemorrhage; formation of adhesions; placental abnormalities wth subsequent pregnancies; incisional problems; thromboembolic phenomenon and other postoperative/anesthesia complications.  The patient concurred with the proposed plan, giving informed written consent for the procedure.    FINDINGS:  Viable female infant in frank breech presentation.  Apgars 9 and 9.  Clear amniotic fluid.  Intact placenta, three vessel cord.  Normal uterus, fallopian tubes and ovaries bilaterally.  ANESTHESIA: Spinal INTRAVENOUS FLUIDS: 1200 ml   ESTIMATED BLOOD LOSS: 575 ml URINE OUTPUT:  100 ml SPECIMENS: Placenta sent to L&D COMPLICATIONS: None immediate  PROCEDURE IN DETAIL:  The patient  preoperatively received intravenous antibiotics and had sequential compression devices applied to her lower extremities.  She was then taken to the operating room where spinal anesthesia was administered and was found to be adequate. She was then placed in a dorsal supine position with a leftward tilt, and prepped and draped in a sterile manner.  A foley catheter was placed into her bladder and attached to constant gravity.  After an adequate timeout was performed, a Pfannenstiel skin incision was made with scalpel and carried through to the underlying layer of fascia. The fascia was incised in the midline, and this incision was extended bilaterally in a blunt fashion.  The underlying rectus muscles were dissected off the fascia superiorly and inferiorly in a blunt fashion. The rectus muscles were separated in the midline and the peritoneum was entered bluntly.  The bladder blade and richardson retractor was introduced into the abdominal cavity.  Attention was turned to the lower uterine segment where a low transverse hysterotomy was made with a scalpel and extended bilaterally bluntly.  The infant was successfully delivered, the cord was clamped and cut after one minute, and the infant was handed over to the awaiting neonatology team. Cord blood and cord gases were collected. Uterine massage was then administered, and the placenta delivered intact with a three-vessel cord. The uterus was then cleared of clots and debris.  The hysterotomy was closed with 0 Vicryl in a running locked fashion. A figure-of-eight 0 Vicryl serosal stitch was placed to help with hemostasis.  The pelvis was cleared of all clot and debris. Hemostasis was confirmed on all surfaces.  The retractor was removed.  The peritoneum was closed with a 0 Vicryl running stitch. The fascia was then closed using 0 Vicryl in a running fashion.  The subcutaneous layer was irrigated and the skin was closed with a 4-0 Vicryl subcuticular stitch. The  patient  tolerated the procedure well. Sponge, instrument and needle counts were correct x 3.  She was taken to the recovery room in stable condition.   Gerlene Fee, DO OB Fellow, [place] [facility] [place] 04/17/2022, 1:02 PM

## 2022-04-17 NOTE — Discharge Summary (Addendum)
Postpartum Discharge Summary  Date of Service updated 04/19/22    Patient Name: Linda Kidd DOB: 1995-03-20 MRN: [id number]  Date of admission: 04/17/2022 Delivery date:04/17/2022  Delivering provider: Lazaro Arms  Date of discharge: 04/19/2022  Admitting diagnosis: S/P cesarean section [Z98.891] Intrauterine pregnancy: [redacted]w[redacted]d     Secondary diagnosis:  Principal Problem:   S/P cesarean section Active Problems:   Anemia affecting pregnancy in third trimester  Additional problems: None    Discharge diagnosis: Term Pregnancy Delivered and Anemia                                              Post partum procedures: none Augmentation: N/A Complications: None  Hospital course: Sceduled C/S   27 y.o. yo G1P1001 at [redacted]w[redacted]d was admitted to the hospital 04/17/2022 for scheduled cesarean section with the following indication:Malpresentation.Delivery details are as follows:  Membrane Rupture Time/Date: 12:12 PM ,04/17/2022   Delivery Method:C-Section, Low Transverse  Details of operation can be found in separate operative note.  Patient had a postpartum course complicated by anemia, and was treated with IV iron infusion. She is ambulating, tolerating a regular diet, passing flatus, and urinating well. Patient is discharged home in stable condition on  04/19/22        Newborn Data: Birth date:04/17/2022  Birth time:12:15 PM  Gender:Female  Living status:Living  Apgars:9 ,9  Weight:7 lb 6.5 oz (3.36 kg)     Magnesium Sulfate received: No BMZ received: No Rhophylac:No MMR:No T-DaP:Given postpartum Flu: No Transfusion:No  Physical exam  Vitals:   04/18/22 0743 04/18/22 1310 04/18/22 2125 04/19/22 0526  BP: 116/72 102/75 107/68 121/65  Pulse: 99 83 100 (!) 114  Resp: 18 18 18 18   Temp: 97.9 F (36.6 C) 98.5 F (36.9 C) 98.3 F (36.8 C) 98.2 F (36.8 C)  TempSrc: Oral Oral Oral Oral  SpO2: 100% 99%  97%  Weight:      Height:       General: alert, cooperative, and no  distress Lochia: appropriate Uterine Fundus: firm Incision: Healing well with no significant drainage, Honeycomb dressing C/D/I DVT Evaluation: No evidence of DVT seen on physical exam. No significant calf/ankle edema. Labs: Lab Results  Component Value Date   WBC 12.1 (H) 04/18/2022   HGB 7.8 (L) 04/18/2022   HCT 24.1 (L) 04/18/2022   MCV 87.6 04/18/2022   PLT 159 04/18/2022      Latest Ref Rng & Units 03/12/2021   11:25 AM  CMP  Glucose 70 - 99 mg/dL 96   BUN 6 - 23 mg/dL 13   Creatinine 4.09 - 1.20 mg/dL 8.11   Sodium 914 - 782 mEq/L 137   Potassium 3.5 - 5.1 mEq/L 3.7   Chloride 96 - 112 mEq/L 104   CO2 19 - 32 mEq/L 28   Calcium 8.4 - 10.5 mg/dL 9.2   Total Protein 6.0 - 8.3 g/dL 7.9   Total Bilirubin 0.2 - 1.2 mg/dL 0.5   Alkaline Phos 39 - 117 U/L 50   AST 0 - 37 U/L 19   ALT 0 - 35 U/L 9    Edinburgh Score:    04/18/2022    8:55 AM  Edinburgh Postnatal Depression Scale Screening Tool  I have been able to laugh and see the funny side of things. 0  I have looked forward with enjoyment to things.  0  I have blamed myself unnecessarily when things went wrong. 1  I have been anxious or worried for no good reason. 0  I have felt scared or panicky for no good reason. 0  Things have been getting on top of me. 0  I have been so unhappy that I have had difficulty sleeping. 0  I have felt sad or miserable. 0  I have been so unhappy that I have been crying. 1  The thought of harming myself has occurred to me. 0  Edinburgh Postnatal Depression Scale Total 2     After visit meds:  Allergies as of 04/19/2022       Reactions   Acetaminophen-caffeine Other (See Comments)   Feels like acid reflux   Excedrin Back & [acetaminophen-aspirin Buffered] Other (See Comments)   Gi burning        Medication List     STOP taking these medications    multivitamin-prenatal 27-0.8 MG Tabs tablet       TAKE these medications    ferrous sulfate 325 (65 FE) MG  tablet Commonly known as: FerrouSul Take 1 tablet (325 mg total) by mouth every other day.   ibuprofen 600 MG tablet Commonly known as: ADVIL Take 1 tablet (600 mg total) by mouth every 6 (six) hours.         Discharge home in stable condition Infant Feeding: Breast Infant Disposition:home with mother Discharge instruction: per After Visit Summary and Postpartum booklet. Activity: Advance as tolerated. Pelvic rest for 6 weeks.  Diet: routine diet Future Appointments: Future Appointments  Date Time Provider Department Center  04/25/2022 10:45 AM [facility] NURSE [facility]  06/01/2022 10:55 AM Reva Bores, MD [facility]   Follow up Visit:  Message sent to [facility] by [place] on 04/19/2022  Please schedule this patient for a In person postpartum visit in 6 weeks with the following provider: MD and APP. Additional Postpartum F/U:Incision check 1 week  Low risk pregnancy complicated by:  breech presentation and anemia Delivery mode:  C-Section, Low Transverse  Anticipated Birth Control:  OCPs   04/19/2022 Raelyn Mora, CNM

## 2022-04-17 NOTE — Anesthesia Postprocedure Evaluation (Signed)
Anesthesia Post Note  Patient: Linda Kidd  Procedure(s) Performed: CESAREAN SECTION (Abdomen)     Anesthesia Type: Spinal Level of consciousness: awake Pain management: pain level controlled Vital Signs Assessment: post-procedure vital signs reviewed and stable Respiratory status: spontaneous breathing Cardiovascular status: stable Postop Assessment: no headache, no backache, spinal receding, patient able to bend at knees and no apparent nausea or vomiting Anesthetic complications: no  No notable events documented.  Last Vitals:  Vitals:   04/17/22 1500 04/17/22 1501  BP: 111/74   Pulse: (!) 101 97  Resp: 17 18  Temp:  36.5 C  SpO2: 95% 100%    Last Pain:  Vitals:   04/17/22 1501  TempSrc: Oral  PainSc:    Pain Goal:    LLE Motor Response: Purposeful movement (04/17/22 1500)   RLE Motor Response: Purposeful movement (04/17/22 1500)       Epidural/Spinal Function Cutaneous sensation: Able to Wiggle Toes (04/17/22 1500), Patient able to flex knees: Yes (04/17/22 1500), Patient able to lift hips off bed: No (04/17/22 1500), Back pain beyond tenderness at insertion site: No (04/17/22 1500), Progressively worsening motor and/or sensory loss: No (04/17/22 1500), Bowel and/or bladder incontinence post epidural: No (04/17/22 1500)  Huston Foley

## 2022-04-17 NOTE — Transfer of Care (Signed)
Immediate Anesthesia Transfer of Care Note  Patient: Linda Kidd  Procedure(s) Performed: CESAREAN SECTION (Abdomen)  Patient Location: PACU  Anesthesia Type:Spinal  Level of Consciousness: awake, alert , and oriented  Airway & Oxygen Therapy: Patient Spontanous Breathing  Post-op Assessment: Report given to RN and Post -op Vital signs reviewed and stable  Post vital signs: Reviewed and stable  Last Vitals:  Vitals Value Taken Time  BP 105/64   Temp    Pulse 105 04/17/22 1318  Resp 17 04/17/22 1318  SpO2 100 % 04/17/22 1318  Vitals shown include unvalidated device data.  Last Pain:  Vitals:   04/17/22 0924  TempSrc: Oral  PainSc: 0-No pain         Complications: No notable events documented.

## 2022-04-17 NOTE — H&P (Signed)
Obstetric Preoperative History and Physical  Linda Kidd is a 27 y.o. G1P0 with IUP at 99w0dpresenting for scheduled cesarean section.  Reports good fetal movement, no bleeding, no contractions, no leaking of fluid.  No acute preoperative concerns.    Cesarean Section Indication: malpresentation: breech  Prenatal Course Source of Care: [place]  with onset of care at 19 weeks Pregnancy complications or risks: Patient Active Problem List   Diagnosis Date Noted   S/P cesarean section 04/17/2022   Breech presentation of fetus 04/06/2022   Anemia affecting pregnancy in third trimester 01/28/2022   Encounter for supervision of normal first pregnancy in second trimester 12/01/2021   Axillary hidradenitis suppurativa 03/12/2021   She plans to breastfeed She desires oral contraceptives (estrogen/progesterone) for postpartum contraception.   Prenatal labs and studies: ABO, Rh: --/--/A POS (02/23 1016) Antibody: NEG (02/23 1016) Rubella: Immune (07/17 0000) RPR: NON REACTIVE (02/23 1030)  HBsAg: Negative (07/17 0000)  HIV: NON REACTIVE (02/23 1030)  [id number]- (02/06 1120) 1 hr Glucola  106 Genetic screening  declined Anatomy UKoreanormal  Prenatal Transfer Tool  Maternal Diabetes: No Genetic Screening: Declined Maternal Ultrasounds/Referrals: Normal Fetal Ultrasounds or other Referrals:  None Maternal Substance Abuse:  No Significant Maternal Medications:  None Significant Maternal Lab Results: Group B Strep negative  Past Medical History:  Diagnosis Date   Medical history non-contributory     Past Surgical History:  Procedure Laterality Date   NO PAST SURGERIES      OB History  Gravida Para Term Preterm AB Living  1            SAB IAB Ectopic Multiple Live Births               # Outcome Date GA Lbr Len/2nd Weight Sex Delivery Anes PTL Lv  1 Current             Social History   Socioeconomic History   Marital status: Married    Spouse name: JPrimary school teacher  Number of  children: 0   Years of education: bachelors    Highest education level: Not on file  Occupational History   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  Tobacco Use   Smoking status: Never   Smokeless tobacco: Never  Vaping Use   Vaping Use: Never used  Substance and Sexual Activity   Alcohol use: Yes    Alcohol/week: 1.0 standard drink of alcohol    Types: 1 Glasses of wine per week    Comment: a drink a week on Fridays   Drug use: Never   Sexual activity: Yes    Birth control/protection: Pill  Other Topics Concern   Not on file  Social History Narrative   01/09/20   From: the area   Living: living with parents, recently [place]   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      Family: works with mom, good relationship with mom and step dad, older brother and younger sister      Enjoys: friends and family, softball      Exercise: walking when she has time, apartment gym   Diet: more veggies, greens, fiber, chicken tuna      Safety   Seat belts: Yes    Guns: No   Safe in relationships: Yes    Social Determinants of Health   Financial Resource Strain: Not on file  Food Insecurity: No Food Insecurity (04/17/2022)   Hunger Vital Sign  Worried About Charity fundraiser in the Last Year: Never true    [place] in the Last Year: Never true  Transportation Needs: No Transportation Needs (04/17/2022)   PRAPARE - Hydrologist (Medical): No    Lack of Transportation (Non-Medical): No  Physical Activity: Not on file  Stress: Not on file  Social Connections: Not on file    Family History  Problem Relation Age of Onset   Cervical cancer Mother    Diabetes Father     Medications Prior to Admission  Medication Sig Dispense Refill Last Dose   Prenatal Vit-Fe Fumarate-FA (MULTIVITAMIN-PRENATAL) 27-0.8 MG TABS tablet Take 1 tablet by mouth daily at 12 noon.   04/16/2022   ferrous sulfate (FERROUSUL) 325 (65 FE)  MG tablet Take 1 tablet (325 mg total) by mouth every other day. (Patient not taking: Reported on 04/13/2022) 30 tablet 3 Unknown    Allergies  Allergen Reactions   Acetaminophen-Caffeine Other (See Comments)    Feels like acid reflux   Excedrin Back & [Acetaminophen-Aspirin Buffered] Other (See Comments)    Gi burning    Review of Systems: Pertinent items noted in HPI and remainder of comprehensive ROS otherwise negative.  Physical Exam: BP 135/86   Pulse (!) 109   Temp 98.2 F (36.8 C) (Oral)   Resp 17   Ht '4\' 9"'$  (1.448 m)   Wt 82.7 kg   LMP 07/18/2021 Comment: freqency every month, last about 4-5 days, average flow  SpO2 99%   BMI 39.47 kg/m  FHR by Doppler: 155 bpm General: Appears well, no acute distress. Age appropriate. Cardiac: Regular rate noted.  Respiratory: normal effort Abdomen: Gravid. Pelvic: Deferred Extremities: No edema or cyanosis. Skin: Warm and dry, no rashes noted Neuro: alert and oriented, no focal deficits Psych: normal affect  Pertinent Labs/Studies:   Results for orders placed or performed during the hospital encounter of 04/15/22 (from the past 72 hour(s))  Type and screen     Status: None   Collection Time: 04/15/22 10:16 AM  Result Value Ref Range   ABO/RH(D) A POS    Antibody Screen NEG    Sample Expiration      04/18/2022,2359 Performed at [facility], [place] [address] [place], [place] [address]   CBC     Status: Abnormal   Collection Time: 04/15/22 10:30 AM  Result Value Ref Range   WBC 7.5 4.0 - 10.5 K/uL   RBC 3.36 (L) 3.87 - 5.11 MIL/uL   Hemoglobin 9.6 (L) 12.0 - 15.0 g/dL   HCT 28.9 (L) 36.0 - 46.0 %   MCV 86.0 80.0 - 100.0 fL   MCH 28.6 26.0 - 34.0 pg   MCHC 33.2 30.0 - 36.0 g/dL   RDW 14.3 11.5 - 15.5 %   Platelets 164 150 - 400 K/uL   nRBC 0.0 0.0 - 0.2 %    Comment: Performed at [facility], [place] [address], [place], [place] [address]  RPR     Status: None   Collection Time: 04/15/22 10:30 AM  Result  Value Ref Range   RPR Ser Ql NON REACTIVE NON REACTIVE    Comment: Performed at [facility], [place] [address] [place], [place] [address]  Rapid HIV screen (HIV 1/2 Ab+Ag)     Status: None   Collection Time: 04/15/22 10:30 AM  Result Value Ref Range   HIV-1 P24 Antigen - HIV24 NON REACTIVE NON REACTIVE  Comment: (NOTE) Detection of p24 may be inhibited by biotin in the sample, causing false negative results in acute infection.    HIV 1/2 Antibodies NON REACTIVE NON REACTIVE   Interpretation (HIV Ag Ab)      A non reactive test result means that HIV 1 or HIV 2 antibodies and HIV 1 p24 antigen were not detected in the specimen.    Comment: Performed at [facility], [place] [address] [place], [place] [address]    Assessment and Plan: Linda Kidd is a 27 y.o. G1P0 at 70w0dbeing admitted for scheduled cesarean section. The risks of surgery were discussed with the patient including but were not limited to: bleeding which may require transfusion or reoperation; infection which may require antibiotics; injury to bowel, bladder, ureters or other surrounding organs; injury to the fetus; need for additional procedures including hysterectomy in the event of a life-threatening hemorrhage; formation of adhesions; placental abnormalities wth subsequent pregnancies; incisional problems; thromboembolic phenomenon and other postoperative/anesthesia complications. The patient concurred with the proposed plan, giving informed written consent for the procedure. Patient has been NPO since last night she will remain NPO for procedure. Anesthesia and OR aware. Preoperative prophylactic antibiotics and SCDs ordered on call to the OR. To OR when ready.    SGerlene Fee DO OB Fellow, [place]2/25/2024, 10:46 AM

## 2022-04-17 NOTE — Lactation Note (Signed)
This note was copied from a baby's chart. Lactation Consultation Note  Patient Name: Linda Kidd Today's Date: 04/17/2022 Reason for consult: Initial assessment;Primapara;1st time breastfeeding;Difficult latch;Term Age:27 years  LC in to room for initial visit. Demonstrated hand expression, collected ~1 mL and spoonfed. Several attempts to latch unsuccessful. Provided hand pump per request and reinforced frequency/use/care and milk storage. Spoonfed ~4 mL of expressed colostrum, collected via manual pump. Infant is crying inconsolably. Birthing parent requested formula and paced bottlefed ~10 mL with an extra slow flow nipple.  LC encouraged feeding appropriate formula volume per age. Provided "bottlefeeding your baby" sheet.   Reviewed normal newborn behavior during first 24h, expected output, tummy size and feeding frequency.   Plan:   1-Feeding on demand.  2-Hand pump use or when formula feeding, volume according to age, pace bottled feeding, frequent burping and upright position. 3-Encouraged birthing parent hydration, nutrition and rest.  Contact LC as needed for feeds/support/concerns/questions. All questions answered at this time. [facility] brochure reviewed.   Maternal Data Has patient been taught Hand Expression?: Yes Does the patient have breastfeeding experience prior to this delivery?: No  Feeding Mother's Current Feeding Choice: Breast Milk and Formula  LATCH Score Latch: Repeated attempts needed to sustain latch, nipple held in mouth throughout feeding, stimulation needed to elicit sucking reflex.  Audible Swallowing: None  Type of Nipple: Everted at rest and after stimulation (short shafted)  Comfort (Breast/Nipple): Soft / non-tender  Hold (Positioning): Assistance needed to correctly position infant at breast and maintain latch.  LATCH Score: 6   Lactation Tools Discussed/Used Tools: Pump;Flanges Flange Size: 21 Breast pump type: Manual Pump Education:  Setup, frequency, and cleaning;Milk Storage Reason for Pumping: stimulation and supplementation Pumping frequency: as needed Pumped volume: 4 mL  Interventions Interventions: Breast feeding basics reviewed;Assisted with latch;Skin to skin;Breast massage;Hand express;Breast compression;Hand pump;Expressed milk;Position options;Support pillows;Adjust position;Education;Pace feeding;LC Services brochure  Discharge Pump: Manual;Personal  Consult Status Consult Status: Follow-up    Eliyas Suddreth A Higuera [place] 04/17/2022, 6:48 PM

## 2022-04-17 NOTE — Anesthesia Preprocedure Evaluation (Signed)
Anesthesia Evaluation  Patient identified by MRN, date of birth, ID band Patient awake    Reviewed: Allergy & Precautions, H&P , NPO status , Patient's Chart, lab work & pertinent test results  Airway Mallampati: II  TM Distance: >3 FB     Dental no notable dental hx.    Pulmonary neg pulmonary ROS   Pulmonary exam normal        Cardiovascular negative cardio ROS Normal cardiovascular exam     Neuro/Psych negative neurological ROS  negative psych ROS   GI/Hepatic negative GI ROS, Neg liver ROS,,,  Endo/Other    Morbid obesity  Renal/GU negative Renal ROS  negative genitourinary   Musculoskeletal   Abdominal  (+) + obese  Peds  Hematology  (+) Blood dyscrasia, anemia   Anesthesia Other Findings   Reproductive/Obstetrics (+) Pregnancy                             Anesthesia Physical Anesthesia Plan  ASA: 3  Anesthesia Plan: Spinal   Post-op Pain Management:    Induction:   PONV Risk Score and Plan: 3 and Ondansetron, Dexamethasone and Scopolamine patch - Pre-op  Airway Management Planned: Natural Airway, Simple Face Mask and Nasal Cannula  Additional Equipment: None  Intra-op Plan:   Post-operative Plan:   Informed Consent: I have reviewed the patients History and Physical, chart, labs and discussed the procedure including the risks, benefits and alternatives for the proposed anesthesia with the patient or authorized representative who has indicated his/her understanding and acceptance.       Plan Discussed with: CRNA  Anesthesia Plan Comments:        Anesthesia Quick Evaluation

## 2022-04-17 NOTE — Lactation Note (Signed)
This note was copied from a baby's chart. Lactation Consultation Note  Patient Name: Linda Kidd Today's Date: 04/17/2022 Reason for consult: Follow-up assessment;Primapara;Term Age:25 hours Mom stated she has latched baby and then supplemented w/formula. Asked mom if she would call for [facility] for next feeding for latching before giving formula. Swaddle demonstrated. Mom was using hand pump when LC came into rm. Mom stated she wanted to see how the hand pump felt verses the DEBP. Mom collected some colostrum. Praised mom. Milk storage reviewed. Maternal Data    Feeding Mother's Current Feeding Choice: Breast Milk and Formula  LATCH Score                    Lactation Tools Discussed/Used    Interventions    Discharge    Consult Status Consult Status: Follow-up Date: 04/18/22 Follow-up type: In-patient    Theodoro Kalata 04/17/2022, 11:06 PM

## 2022-04-17 NOTE — Anesthesia Procedure Notes (Signed)
Spinal  Patient location during procedure: OR Start time: 04/17/2022 11:46 AM End time: 04/17/2022 11:48 AM Reason for block: surgical anesthesia Staffing Performed: anesthesiologist  Anesthesiologist: Lyn Hollingshead, MD Performed by: Lyn Hollingshead, MD Authorized by: Lyn Hollingshead, MD   Preanesthetic Checklist Completed: patient identified, IV checked, site marked, risks and benefits discussed, surgical consent, monitors and equipment checked, pre-op evaluation and timeout performed Spinal Block Patient position: sitting Prep: DuraPrep and site prepped and draped Patient monitoring: continuous pulse ox and blood pressure Approach: midline Location: L3-4 Injection technique: single-shot Needle Needle type: Pencan  Needle gauge: 24 G Needle length: 10 cm Needle insertion depth: 4 cm Assessment Sensory level: T4 Events: CSF return

## 2022-04-18 LAB — CBC
HCT: 24.1 % — ABNORMAL LOW (ref 36.0–46.0)
Hemoglobin: 7.8 g/dL — ABNORMAL LOW (ref 12.0–15.0)
MCH: 28.4 pg (ref 26.0–34.0)
MCHC: 32.4 g/dL (ref 30.0–36.0)
MCV: 87.6 fL (ref 80.0–100.0)
Platelets: 159 10*3/uL (ref 150–400)
RBC: 2.75 MIL/uL — ABNORMAL LOW (ref 3.87–5.11)
RDW: 14.3 % (ref 11.5–15.5)
WBC: 12.1 10*3/uL — ABNORMAL HIGH (ref 4.0–10.5)
nRBC: 0 % (ref 0.0–0.2)

## 2022-04-18 MED ORDER — IRON SUCROSE 500 MG IVPB - SIMPLE MED
500.0000 mg | Freq: Once | INTRAVENOUS | Status: AC
  Administered 2022-04-18: 500 mg via INTRAVENOUS
  Filled 2022-04-18: qty 275

## 2022-04-18 NOTE — Progress Notes (Addendum)
POSTPARTUM PROGRESS NOTE  Subjective: Linda Kidd is a 27 y.o. G1P1001 s/p LTCS at [redacted]w[redacted]d  She reports she doing well. No acute events overnight. She denies any problems with ambulating, voiding or po intake. Denies nausea or vomiting. She has not passed flatus or had BM yet. Pain is well controlled.  Lochia is improving and appropriate.  Objective: Blood pressure (!) 96/55, pulse (!) 103, temperature 98.3 F (36.8 C), temperature source Oral, resp. rate 18, height '4\' 9"'$  (1.448 m), weight 82.7 kg, last menstrual period 07/18/2021, SpO2 100 %, unknown if currently breastfeeding.  Physical Exam:  General: alert, cooperative and no distress Chest: no respiratory distress Abdomen: soft, non-tender  Uterine Fundus: firm and at level of umbilicus Extremities: No calf swelling or tenderness  Recent Labs    04/15/22 1030 04/18/22 0457  HGB 9.6* 7.8*  HCT 28.9* 24.1*    Assessment/Plan: Linda Kidd is a 27y.o. G1P1001 s/p LTCS at 332w0dor breech presentation.  Routine Postpartum Care: Doing well, pain well-controlled.  -- Continue routine care, lactation support  -- Contraception: OCP -- Feeding: breast  Anemia - Hgb downtrended to 7.8 and stably tachycardic. Denies dizziness or weakness. - Give Venofer '500mg'$  IV   Dispo: Plan for discharge tomorrow.  ZhArlyce DiceMDAcampoor WoJohn[facility] 04/18/2022 6:20 AM

## 2022-04-18 NOTE — Lactation Note (Signed)
This note was copied from a baby's chart. Lactation Consultation Note  Patient Name: Linda Kidd Today's Date: 04/18/2022 Age : 25 hours old  Reason for consult: Follow-up assessment;Primapara;1st time breastfeeding;Term;Infant weight loss (baby rooting, LC offered to assist to latch and per mom ;preferred to feed her baby a bottle this feeding, LC encouraged mom to call when she desires to latch her baby or pump for assistance.) LC reviewed supply and demand / importance of giving practice at the breast latching to enhance her milk coming in.  Maternal Data    Feeding Mother's Current Feeding Choice: Breast Milk and Formula Nipple Type: Slow - flow  LATCH Score   Lactation Tools Discussed/Used  DEBP had been set up prior to this [facility] consult.   Interventions Interventions: Breast feeding basics reviewed;Education;LC Services brochure  Discharge Pump: Manual;Personal;DEBP  Consult Status Consult Status: Follow-up Date: 04/18/22 Follow-up type: In-patient    [place] 04/18/2022, 11:37 AM

## 2022-04-18 NOTE — Progress Notes (Signed)
POSTPARTUM PROGRESS NOTE  Subjective: Linda Kidd is a 27 y.o. G1P1001 s/p LTCS at [redacted]w[redacted]d  She reports she doing well. No acute events overnight. She denies any problems with ambulating, voiding or po intake. Denies nausea or vomiting. She has not passed flatus or had BM yet. Pain is well controlled.  Lochia is improving and appropriate.  Objective: Blood pressure 116/72, pulse 99, temperature 97.9 F (36.6 C), temperature source Oral, resp. rate 18, height '4\' 9"'$  (1.448 m), weight 82.7 kg, last menstrual period 07/18/2021, SpO2 100 %, unknown if currently breastfeeding.  Physical Exam:  General: alert, cooperative and no distress Chest: no respiratory distress Abdomen: soft, non-tender  Uterine Fundus: firm and at level of umbilicus Extremities: No calf swelling or tenderness  Recent Labs    04/18/22 0457  HGB 7.8*  HCT 24.1*     Assessment/Plan: Linda Kidd is a 27y.o. G1P1001 s/p LTCS at 330w0dor breech presentation.  Routine Postpartum Care: Doing well, pain well-controlled.  -- Continue routine care, lactation support  -- Contraception: OCP -- Feeding: breast  Anemia - Hgb downtrended to 7.8 and stably tachycardic. Denies dizziness or weakness. - Give Venofer '500mg'$  IV   Dispo: Plan for discharge tomorrow or 2/28.  JaArlyce DiceMDWeissport Eastor [facility] 04/18/2022 11:32 AM   GME ATTESTATION:  I saw and evaluated the patient. I agree with the findings and the plan of care as documented in the resident's note. I have made changes to documentation as necessary.  SiGerlene FeeDO OB Fellow, Fa[place] Wo[place]/26/2024, 11:32 AM

## 2022-04-19 MED ORDER — IBUPROFEN 600 MG PO TABS: 600.0000 mg | ORAL_TABLET | Freq: Four times a day (QID) | ORAL | 0 refills | Status: DC

## 2022-04-19 NOTE — Lactation Note (Signed)
This note was copied from a baby's chart. Lactation Consultation Note  Patient Name: Linda Kidd Today's Date: 04/19/2022 Age:25 hours Reason for consult: Follow-up assessment;Primapara;1st time breastfeeding;Term;Infant weight loss (4 % weight loss, per mom probably just going to pump and bottle feed. Milk is coming in. LC reviewed Bf D/C teaching and the [facility] resources. per mom has a DEBP at home.)   Maternal Data    Feeding Mother's Current Feeding Choice: Breast Milk and Formula Nipple Type: Slow - flow  Lactation Tools Discussed/Used Tools: Pump;Flanges Flange Size: 21 Breast pump type: Double-Electric Breast Pump;Manual Pump Education: Milk Storage  Interventions Interventions: Breast feeding basics reviewed;Education  Discharge Discharge Education: Warning signs for feeding baby;Engorgement and breast care Pump: Manual;Hands Free;Personal  Consult Status Consult Status: Complete Date: 04/19/22    Myer Haff 04/19/2022, 12:46 PM

## 2022-04-19 NOTE — Discharge Instructions (Signed)
Return to MAU: If you have heavier bleeding that soaks through more that 2 pads per hour for an hour or more If you bleed so much that you feel like you might pass out or you do pass out If you have significant abdominal pain that is not improved with Tylenol 1000 mg every 8 hours as needed for pain If you develop a fever > 100.5

## 2022-04-20 ENCOUNTER — Encounter: Admitting: Family Medicine

## 2022-04-25 ENCOUNTER — Ambulatory Visit (INDEPENDENT_AMBULATORY_CARE_PROVIDER_SITE_OTHER): Admitting: *Deleted

## 2022-04-25 VITALS — BP 131/81 | HR 111 | Wt 164.0 lb

## 2022-04-25 DIAGNOSIS — Z98891 History of uterine scar from previous surgery: Secondary | ICD-10-CM

## 2022-04-25 NOTE — Progress Notes (Signed)
Subjective:     Linda Kidd is a 27 y.o. female who presents to the clinic 1 weeks status post low uterine, transverse cesarean section. Pt reports incision is healing well.      Objective:    BP 131/81   Pulse (!) 111   Wt 164 lb (74.4 kg)   Breastfeeding Yes   BMI 35.49 kg/m  General:  alert, well appearing, in no apparent distress  Incision:   healing well, no drainage, no erythema, no hernia, no seroma, no swelling, no dehiscence, incision well approximated     Assessment:    Doing well postoperatively.   Plan:   1. Continue any current medications. 2. Wound care discussed. 3. Follow up: as needed and or at postpartum visit.   Crosby Oyster, RN

## 2022-04-27 ENCOUNTER — Encounter: Admitting: Family Medicine

## 2022-05-21 ENCOUNTER — Telehealth (HOSPITAL_COMMUNITY): Payer: Self-pay

## 2022-05-21 NOTE — Telephone Encounter (Signed)
Preadmission testing 

## 2022-06-01 ENCOUNTER — Telehealth (INDEPENDENT_AMBULATORY_CARE_PROVIDER_SITE_OTHER): Admitting: Family Medicine

## 2022-06-01 DIAGNOSIS — Z30011 Encounter for initial prescription of contraceptive pills: Secondary | ICD-10-CM

## 2022-06-01 DIAGNOSIS — Z1339 Encounter for screening examination for other mental health and behavioral disorders: Secondary | ICD-10-CM | POA: Diagnosis not present

## 2022-06-01 MED ORDER — NORETHIN ACE-ETH ESTRAD-FE 1-20 MG-MCG PO TABS: 1.0000 | ORAL_TABLET | Freq: Every day | ORAL | 3 refills | Status: DC

## 2022-06-01 NOTE — Progress Notes (Signed)
Provider location: [facility] for [facility]   Patient location: Home  I connected withNAME@ on 06/01/22 at 10:55 AM EDT by Mychart Video Encounter and verified that I am speaking with the correct person using two identifiers.       I discussed the limitations, risks, security and privacy concerns of performing an evaluation and management service virtually and the availability of in person appointments. I also discussed with the patient that there may be a patient responsible charge related to this service. The patient expressed understanding and agreed to proceed.  Post Partum Visit Note Subjective:   Linda Kidd is a 27 y.o. G41P1001 female who presents for a postpartum visit. She is 6 weeks postpartum following a Low Transverse Cesarean Section .  I have fully reviewed the prenatal and intrapartum course. The delivery was at 39 gestational weeks.  Anesthesia: spinal. Postpartum course has been uncomplicated. Baby is doing well. Baby is feeding by bottle - Enamil gentlease . Bleeding no bleeding. Bowel function is normal. Bladder function is normal. Patient is sexually active. Contraception method is none. Postpartum depression screening: negative.   The pregnancy intention screening data noted above was reviewed. Potential methods of contraception were discussed. The patient elected to proceed with No data recorded.   Edinburgh Postnatal Depression Scale - 06/01/22 1051       Edinburgh Postnatal Depression Scale:  In the Past 7 Days   I have been able to laugh and see the funny side of things. 0    I have looked forward with enjoyment to things. 0    I have blamed myself unnecessarily when things went wrong. 0    I have been anxious or worried for no good reason. 0    I have felt scared or panicky for no good reason. 0    Things have been getting on top of me. 0    I have been so unhappy that I have had difficulty sleeping. 0    I have felt sad or miserable.  0    I have been so unhappy that I have been crying. 0    The thought of harming myself has occurred to me. 0    Edinburgh Postnatal Depression Scale Total 0             The following portions of the patient's history were reviewed and updated as appropriate: allergies, current medications, past family history, past medical history, past social history, past surgical history, and problem list.  Review of Systems Pertinent items noted in HPI and remainder of comprehensive ROS otherwise negative.  Objective:  There were no vitals taken for this visit.    General:  Alert, oriented and cooperative. Patient is in no acute distress.  Respiratory: Normal respiratory effort, no problems with respiration noted  Mental Status: Normal mood and affect. Normal behavior. Normal judgment and thought content.  Rest of physical exam deferred due to type of encounter   Assessment:    Normal  postpartum exam.  Plan:  Essential components of care per ACOG recommendations:  1.  Mood and well being: Patient with negative depression screening today. Reviewed local resources for support.  - Patient does not use tobacco.  - hx of drug use? No   2. Infant care and feeding:  -Patient currently breastmilk feeding? No  -Social determinants of health (SDOH) reviewed in EPIC. No concerns  3. Sexuality, contraception and birth spacing - Patient does not want a pregnancy in the  next year.  Desired family size is 3 children.  - Reviewed forms of contraception in tiered fashion. Patient desired oral contraceptives (estrogen/progesterone) today.   - Discussed birth spacing of 18 months  4. Sleep and fatigue -Encouraged family/partner/community support of 4 hrs of uninterrupted sleep to help with mood and fatigue  5. Physical Recovery  - Discussed patients delivery PLTCS due to breech and complications  - Patient has urinary incontinence? No - Patient is safe to resume physical and sexual activity  6.   Health Maintenance - Last pap smear done 09/2021 and was normal   7. No Chronic Disease  I provided 11 minutes of face-to-face time during this encounter.    Return in about 4 months (around 10/01/2022) for a CPE.  No future appointments.   Reva Bores, MD Center for [employer], [facility]

## 2022-06-30 ENCOUNTER — Ambulatory Visit
Admission: EM | Admit: 2022-06-30 | Discharge: 2022-06-30 | Disposition: A | Discharge status: Home / Self Care | Attending: Emergency Medicine | Admitting: Emergency Medicine

## 2022-06-30 ENCOUNTER — Ambulatory Visit (INDEPENDENT_AMBULATORY_CARE_PROVIDER_SITE_OTHER)

## 2022-06-30 DIAGNOSIS — M25539 Pain in unspecified wrist: Secondary | ICD-10-CM | POA: Insufficient documentation

## 2022-06-30 DIAGNOSIS — M25531 Pain in right wrist: Secondary | ICD-10-CM

## 2022-06-30 NOTE — Discharge Instructions (Addendum)
Take Tylenol or ibuprofen as directed.  Rest and elevate your wrist.  Apply ice packs 2-3 times a day for up to 20 minutes each.  Wear the wrist splint as needed for comfort.    Follow up with an orthopedist if your symptoms are not improving.

## 2022-06-30 NOTE — ED Triage Notes (Signed)
Patient to Urgent Care with complaints of right sided wrist pain that started one month ago. Describes an aching pain. Unsure if she had any kind of injury.  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 States she picked a child up this morning and had a sharp and shooting pain from her thumb to wrist.

## 2022-06-30 NOTE — ED Provider Notes (Signed)
Linda Kidd    CSN: [id number] Arrival date & time: 06/30/22  1020      History   Chief Complaint Chief Complaint  Patient presents with   Wrist Pain    HPI Linda Kidd is a 27 y.o. female.  Patient presents with right wrist pain x 1 month but acutely worse this morning when she picked up a child at work.  The pain this morning was sudden, sharp, radiated up her arm.  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  No wounds, redness, bruising, swelling, numbness, weakness, or other symptoms.  No treatment attempted.  No pertinent medical history.    The history is provided by the patient and medical records.    Past Medical History:  Diagnosis Date   Medical history non-contributory     Patient Active Problem List   Diagnosis Date Noted   Wrist pain 06/30/2022   S/P cesarean section 04/17/2022   Axillary hidradenitis suppurativa 03/12/2021    Past Surgical History:  Procedure Laterality Date   CESAREAN SECTION N/A 04/17/2022   Procedure: CESAREAN SECTION;  Surgeon: Lazaro Arms, MD;  Location: [facility] LD [facility]S;  Service: Obstetrics;  Laterality: N/A;   NO PAST SURGERIES      OB History     Gravida  1   Para  1   Term  1   Preterm      AB      Living  1      SAB      IAB      Ectopic      Multiple  0   Live Births  1            Home Medications    Prior to Admission medications   Medication Sig Start Date End Date Taking? Authorizing Provider  ferrous sulfate (FERROUSUL) 325 (65 FE) MG tablet Take 1 tablet (325 mg total) by mouth every other day. Patient not taking: Reported on 04/13/2022 01/28/22   Tereso Newcomer, MD  ibuprofen (ADVIL) 600 MG tablet Take 1 tablet (600 mg total) by mouth every 6 (six) hours. 04/19/22   Lincoln Brigham, MD  norethindrone-ethinyl estradiol-FE (JUNEL FE 1/20) 1-20 MG-MCG tablet Take 1 tablet by mouth daily. 06/01/22   Reva Bores, MD    Family History Family History  Problem Relation Age  of Onset   Cervical cancer Mother    Diabetes Father     Social History Social History   Tobacco Use   Smoking status: Never   Smokeless tobacco: Never  Vaping Use   Vaping Use: Never used  Substance Use Topics   Alcohol use: Yes    Alcohol/week: 1.0 standard drink of alcohol    Types: 1 Glasses of wine per week    Comment: a drink a week on Fridays   Drug use: Never     Allergies   Acetaminophen-caffeine and Excedrin back & [acetaminophen-aspirin buffered]   Review of Systems Review of Systems  Constitutional:  Negative for chills and fever.  Musculoskeletal:  Positive for arthralgias. Negative for joint swelling.  Skin:  Negative for color change, rash and wound.  Neurological:  Negative for weakness and numbness.     Physical Exam Triage Vital Signs ED Triage Vitals  Enc Vitals Group     BP 06/30/22 1119 117/68     Pulse Rate 06/30/22 1110 90     Resp 06/30/22 1110 18     Temp 06/30/22 1110 97.8  F (36.6 C)     Temp src --      SpO2 06/30/22 1110 98 %     Weight --      Height --      Head Circumference --      Peak Flow --      Pain Score 06/30/22 1117 5     Pain Loc --      Pain Edu? --      Excl. in GC? --    No data found.  Updated Vital Signs BP 117/68   Pulse 90   Temp 97.8 F (36.6 C)   Resp 18   SpO2 98%   Visual Acuity Right Eye Distance:   Left Eye Distance:   Bilateral Distance:    Right Eye Near:   Left Eye Near:    Bilateral Near:     Physical Exam Vitals and nursing note reviewed.  Constitutional:      General: She is not in acute distress.    Appearance: Normal appearance. She is well-developed. She is not ill-appearing.  HENT:     Mouth/Throat:     Mouth: Mucous membranes are moist.  Cardiovascular:     Rate and Rhythm: Normal rate and regular rhythm.     Heart sounds: Normal heart sounds.  Pulmonary:     Effort: Pulmonary effort is normal. No respiratory distress.     Breath sounds: Normal breath sounds.   Musculoskeletal:        General: Tenderness present. No swelling, deformity or signs of injury. Normal range of motion.       Hands:     Cervical back: Neck supple.  Skin:    General: Skin is warm and dry.     Capillary Refill: Capillary refill takes less than 2 seconds.     Findings: No bruising, erythema, lesion or rash.  Neurological:     General: No focal deficit present.     Mental Status: She is alert and oriented to person, place, and time.     Sensory: No sensory deficit.     Motor: No weakness.  Psychiatric:        Mood and Affect: Mood normal.        Behavior: Behavior normal.      UC Treatments / Results  Labs (all labs ordered are listed, but only abnormal results are displayed) Labs Reviewed - No data to display  EKG   Radiology DG Wrist Complete Right  Result Date: 06/30/2022 CLINICAL DATA:  Pain after injury EXAM: RIGHT WRIST - COMPLETE 3 VIEW COMPARISON:  None Available. FINDINGS: There is no evidence of fracture or dislocation. There is no evidence of arthropathy or other focal bone abnormality. Soft tissues are unremarkable. If there is persistent pain or further concern of scaphoid injury recommend treatment with follow-up imaging in 7-10 days to assess for occult abnormality. IMPRESSION: No acute osseous abnormality Electronically Signed   By: Karen Kays M.D.   On: 06/30/2022 11:51    Procedures Procedures (including critical care time)  Medications Ordered in UC Medications - No data to display  Initial Impression / Assessment and Plan / UC Course  I have reviewed the triage vital signs and the nursing notes.  Pertinent labs & imaging results that were available during my care of the patient were reviewed by me and considered in my medical decision making (see chart for details).    Right wrist pain.  Xray negative.  Treating with wrist splint as needed  for comfort, rest, elevation, ice packs, Tylenol or ibuprofen.  Education provided on wrist  pain.  Instructed patient to follow-up with orthopedics if her symptoms are not improving.  Contact information for on-call Ortho provided.  Patient agrees to plan of care.   Final Clinical Impressions(s) / UC Diagnoses   Final diagnoses:  Right wrist pain     Discharge Instructions      Take Tylenol or ibuprofen as directed.  Rest and elevate your wrist.  Apply ice packs 2-3 times a day for up to 20 minutes each.  Wear the wrist splint as needed for comfort.    Follow up with an orthopedist if your symptoms are not improving.        ED Prescriptions   None    PDMP not reviewed this encounter.   Mickie Bail, NP 06/30/22 1201

## 2023-04-21 ENCOUNTER — Telehealth: Payer: Self-pay | Admitting: Physician Assistant

## 2023-04-21 DIAGNOSIS — O2341 Unspecified infection of urinary tract in pregnancy, first trimester: Secondary | ICD-10-CM

## 2023-04-21 MED ORDER — CEPHALEXIN 500 MG PO CAPS
500.0000 mg | ORAL_CAPSULE | Freq: Two times a day (BID) | ORAL | 0 refills | Status: DC
Start: 2023-04-21 — End: 2023-06-16

## 2023-04-21 NOTE — Progress Notes (Signed)

## 2023-05-18 ENCOUNTER — Ambulatory Visit: Admitting: Obstetrics and Gynecology

## 2023-05-18 ENCOUNTER — Encounter: Payer: Self-pay | Admitting: Obstetrics and Gynecology

## 2023-05-18 VITALS — BP 122/78 | HR 114 | Wt 156.0 lb

## 2023-05-18 DIAGNOSIS — O0990 Supervision of high risk pregnancy, unspecified, unspecified trimester: Secondary | ICD-10-CM | POA: Insufficient documentation

## 2023-05-18 DIAGNOSIS — Z3480 Encounter for supervision of other normal pregnancy, unspecified trimester: Secondary | ICD-10-CM | POA: Diagnosis not present

## 2023-05-18 DIAGNOSIS — Z98891 History of uterine scar from previous surgery: Secondary | ICD-10-CM | POA: Diagnosis not present

## 2023-05-18 DIAGNOSIS — O99210 Obesity complicating pregnancy, unspecified trimester: Secondary | ICD-10-CM | POA: Insufficient documentation

## 2023-05-18 MED ORDER — ASPIRIN 81 MG PO TBEC: 81.0000 mg | DELAYED_RELEASE_TABLET | Freq: Every day | ORAL | 1 refills | Status: DC

## 2023-05-18 NOTE — Progress Notes (Signed)
   PRENATAL VISIT NOTE  Transfer of care visit from [facility], [place]  Subjective:  Linda Kidd is a 28 y.o. G2P1001 at [redacted]w[redacted]d being seen today for ongoing prenatal care.  She is currently monitored for the following issues for this low-risk pregnancy and has Axillary hidradenitis suppurativa; History of cesarean delivery; Wrist pain; and Supervision of other normal pregnancy, antepartum on their problem list.  Patient reports no complaints.  Contractions: Not present. Vag. Bleeding: None.  Movement: Present. Denies leaking of fluid.   The following portions of the patient's history were reviewed and updated as appropriate: allergies, current medications, past family history, past medical history, past social history, past surgical history and problem list.   Objective:   Vitals:   05/18/23 1011  BP: 122/78  Pulse: (!) 114  Weight: 156 lb (70.8 kg)    Fetal Status: Fetal Heart Rate (bpm): 150   Movement: Present     General:  Alert, oriented and cooperative. Patient is in no acute distress.  Skin: Skin is warm and dry. No rash noted.   Cardiovascular: Normal heart rate noted  Respiratory: Normal respiratory effort, no problems with respiration noted  Abdomen: Soft, gravid, appropriate for gestational age.  Pain/Pressure: Present     Pelvic: Cervical exam deferred        Extremities: Normal range of motion.  Edema: None  Mental Status: Normal mood and affect. Normal behavior. Normal judgment and thought content.   Assessment and Plan:  Pregnancy: G2P1001 at [redacted]w[redacted]d 1. Supervision of other normal pregnancy, antepartum (Primary) Patient to sign ROI and call to get records sent over. Epic to try and pull anything in. She states she had an u/s around 10wk that gave her an [facility] c/w her LMP. She also identifies no problems in the pregnancy, thus far. Declines AFP. Pt amenable to low dose ASA.  - [place] MFM OB COMP + 14 WK; Future  2. History of cesarean delivery 03/2022 PLTCS  for breech. D/w her she is a tolac candidate and talk to her more about this later in pregnancy  Preterm labor symptoms and general obstetric precautions including but not limited to vaginal bleeding, contractions, leaking of fluid and fetal movement were reviewed in detail with the patient. Please refer to After Visit Summary for other counseling recommendations.   Return in about 1 month (around 06/18/2023) for 4-5wks, low risk ob, in person, md visit.  Future Appointments  Date Time Provider Department Center  06/15/2023 10:35 AM Anyanwu, Jethro Bastos, MD [facility]  07/13/2023  8:55 AM Warren-Hill, Clarita Crane, CNM [facility]    Dubois Bing, MD

## 2023-05-18 NOTE — Progress Notes (Signed)
 NOB  Pt had Labs done in [place] did sign ROI records not received. Pt will call again to have records sent.  Last Pap: 09/29/21. Genetic Screening: Declines.  CC: None

## 2023-06-13 ENCOUNTER — Ambulatory Visit

## 2023-06-15 ENCOUNTER — Ambulatory Visit: Admitting: Obstetrics & Gynecology

## 2023-06-15 ENCOUNTER — Encounter: Payer: Self-pay | Admitting: Obstetrics & Gynecology

## 2023-06-15 VITALS — BP 110/74 | HR 102

## 2023-06-15 DIAGNOSIS — Z3480 Encounter for supervision of other normal pregnancy, unspecified trimester: Secondary | ICD-10-CM | POA: Diagnosis not present

## 2023-06-15 DIAGNOSIS — Z3A20 20 weeks gestation of pregnancy: Secondary | ICD-10-CM | POA: Diagnosis not present

## 2023-06-15 DIAGNOSIS — Z98891 History of uterine scar from previous surgery: Secondary | ICD-10-CM | POA: Diagnosis not present

## 2023-06-15 NOTE — Progress Notes (Signed)
   PRENATAL VISIT NOTE  Subjective:  Linda Kidd is a 28 y.o. G2P1001 at [redacted]w[redacted]d being seen today for ongoing prenatal care.  She is currently monitored for the following issues for this low-risk pregnancy and has Axillary hidradenitis suppurativa; History of cesarean delivery; Wrist pain; Supervision of other normal pregnancy, antepartum; and Obesity in pregnancy on their problem list.  Patient reports no complaints.  Contractions: Not present. Vag. Bleeding: None.  Movement: Present. Denies leaking of fluid.   The following portions of the patient's history were reviewed and updated as appropriate: allergies, current medications, past family history, past medical history, past social history, past surgical history and problem list.   Objective:   Vitals:   06/15/23 1040  BP: 110/74  Pulse: (!) 102    Fetal Status: Fetal Heart Rate (bpm): 148   Movement: Present     General:  Alert, oriented and cooperative. Patient is in no acute distress.  Skin: Skin is warm and dry. No rash noted.   Cardiovascular: Normal heart rate noted  Respiratory: Normal respiratory effort, no problems with respiration noted  Abdomen: Soft, gravid, appropriate for gestational age.  Pain/Pressure: Absent     Pelvic: Cervical exam deferred        Extremities: Normal range of motion.  Edema: None  Mental Status: Normal mood and affect. Normal behavior. Normal judgment and thought content.   Assessment and Plan:  Pregnancy: G2P1001 at [redacted]w[redacted]d 1. History of cesarean delivery (Primary) 2. [redacted] weeks gestation of pregnancy 3. Supervision of other normal pregnancy, antepartum Anatomy scan scheduled.  No other concerns. Preterm labor symptoms and general obstetric precautions including but not limited to vaginal bleeding, contractions, leaking of fluid and fetal movement were reviewed in detail with the patient. Please refer to After Visit Summary for other counseling recommendations.   Return in about 4 weeks  (around 07/13/2023) for OFFICE OB VISIT (MD only).  Future Appointments  Date Time Provider Department Center  06/16/2023  8:00 AM [facility] PROVIDER 1 [facility]  06/16/2023  8:30 AM [facility] US1 [facility]  07/13/2023  8:55 AM Delfina Feller Aviva Lemmings, CNM [facility]  08/10/2023  8:15 AM [facility] LAB [facility]  08/10/2023  8:35 AM Raynell Caller, MD [facility]    Lenoard Rad, MD

## 2023-06-16 ENCOUNTER — Ambulatory Visit: Attending: Obstetrics and Gynecology

## 2023-06-16 ENCOUNTER — Other Ambulatory Visit: Payer: Self-pay | Admitting: *Deleted

## 2023-06-16 ENCOUNTER — Ambulatory Visit: Admitting: Maternal & Fetal Medicine

## 2023-06-16 VITALS — BP 114/68 | HR 104

## 2023-06-16 DIAGNOSIS — Z3689 Encounter for other specified antenatal screening: Secondary | ICD-10-CM | POA: Insufficient documentation

## 2023-06-16 DIAGNOSIS — Z363 Encounter for antenatal screening for malformations: Secondary | ICD-10-CM | POA: Diagnosis not present

## 2023-06-16 DIAGNOSIS — O283 Abnormal ultrasonic finding on antenatal screening of mother: Secondary | ICD-10-CM

## 2023-06-16 DIAGNOSIS — O4442 Low lying placenta NOS or without hemorrhage, second trimester: Secondary | ICD-10-CM | POA: Insufficient documentation

## 2023-06-16 DIAGNOSIS — Z3A20 20 weeks gestation of pregnancy: Secondary | ICD-10-CM

## 2023-06-16 DIAGNOSIS — O35BXX0 Maternal care for other (suspected) fetal abnormality and damage, fetal cardiac anomalies, not applicable or unspecified: Secondary | ICD-10-CM

## 2023-06-16 DIAGNOSIS — E669 Obesity, unspecified: Secondary | ICD-10-CM

## 2023-06-16 DIAGNOSIS — Z3480 Encounter for supervision of other normal pregnancy, unspecified trimester: Secondary | ICD-10-CM

## 2023-06-16 DIAGNOSIS — Z3686 Encounter for antenatal screening for cervical length: Secondary | ICD-10-CM | POA: Insufficient documentation

## 2023-06-16 DIAGNOSIS — O99212 Obesity complicating pregnancy, second trimester: Secondary | ICD-10-CM | POA: Diagnosis not present

## 2023-06-16 NOTE — Progress Notes (Signed)
 Patient information  Patient Name: Linda Kidd  Patient MRN:   [id number]  Referring practice: MFM Referring Provider: [facility]  MFM CONSULT  Linda Kidd is a 28 y.o. G2P1001 at [redacted]w[redacted]d here for ultrasound and consultation. Patient Active Problem List   Diagnosis Date Noted   Supervision of other normal pregnancy, antepartum 05/18/2023   Obesity in pregnancy 05/18/2023   Wrist pain 06/30/2022   History of cesarean delivery 04/17/2022   Axillary hidradenitis suppurativa 03/12/2021   Linda Kidd has a pregnancy with the complications mentioned in the problem list. During today's visit we focused on the following concerns:   RE prior cesarean: Patient is considering a trial of labor versus a scheduled cesarean delivery.  At this time she is favoring an actual vaginal delivery but her final decision will be made by pending pregnancy.  The placenta is anterior and will be reassessed at future visits but today there is no evidence of placenta accreta or abnormal placentation.  RE low lying placenta: The placenta is approximately 0.8 cm away from the internal os.  This is likely to resolve prior to delivery.  Will reassess in about 6 to 8 weeks.  RE echogenic intracardiac focus: There is an echogenic focus noted within the left ventricle.  I discussed this is a soft marker that is associate with Down syndrome.  This likely doubles the patient's age-related risk which is low overall approximately 1 and 990 based on her age but after the ultrasound the risk is 1 out of 354 due to the presence of the echogenic intracardiac focus.  The patient has not elected to have NIPT testing.  I encouraged her to discuss this with her OB provider.  If she has low risk aneuploidy screening then amniocentesis is not necessary.  I also discussed amniocentesis as the only method of achieving a diagnostic result.  She declined at this time and will talk to her OB provider about  noninvasive screening methods  Sonographic findings Single intrauterine pregnancy at 20w 4d. Fetal cardiac activity:  Observed and appears normal. Presentation: Variable. The anatomic structures that were well seen appear normal except for an EIF. The anatomic survey is complete.  Fetal biometry shows the estimated fetal weight at the 18 percentile. Amniotic fluid: Within normal limits. Placenta: Anterior, low-lying, 0.8 cm from int os. Adnexa: No abnormality visualized. Cervical length: 3 cm.  There are limitations of prenatal ultrasound such as the inability to detect certain abnormalities due to poor visualization. Various factors such as fetal position, gestational age and maternal body habitus may increase the difficulty in visualizing the fetal anatomy.    Recommendations -EDD should be 10/30/2023 based on  LMP  (01/23/23). -Follow up ultrasound in 6 weeks to reassess the fetal growth -Detailed ultrasound was done today without abnormalities.  A low-lying placenta was observed and will be assessed in the future but is likely to resolve.  -Follow-up anatomy and fetal growth in 6 weeks -The patient will discuss with her OB provider regarding aneuploidy screening due to the concern echogenic intracardiac -Continue routine prenatal care with referring OB provider  Review of Systems: A review of systems was performed and was negative except per HPI   Vitals and Physical Exam    06/16/2023    7:59 AM 06/15/2023   10:40 AM 05/18/2023   10:11 AM  Vitals with BMI  Weight  -- 156 lbs  Systolic 114 110 409  Diastolic 68 74 78  Pulse 104 102 114   Sitting comfortably on the sonogram table Nonlabored breathing Normal rate and rhythm Abdomen is nontender  Past pregnancies OB History  Gravida Para Term Preterm AB Living  2 1 1   1   SAB IAB Ectopic Multiple Live Births     0 1    # Outcome Date GA Lbr Len/2nd Weight Sex Type Anes PTL Lv  2 Current           1 Term 04/17/22  [redacted]w[redacted]d  7 lb 6.5 oz (3.36 kg) F CS-LTranv Spinal  LIV     I spent 45 minutes reviewing the patients chart, including labs and images as well as counseling the patient about her medical conditions. Greater than 50% of the time was spent in direct face-to-face patient counseling.  Linda Bowling, DO Maternal fetal medicine, [place]   06/16/2023  2:03 PM

## 2023-06-17 ENCOUNTER — Encounter: Payer: Self-pay | Admitting: Obstetrics and Gynecology

## 2023-06-17 DIAGNOSIS — O283 Abnormal ultrasonic finding on antenatal screening of mother: Secondary | ICD-10-CM | POA: Insufficient documentation

## 2023-06-19 ENCOUNTER — Encounter: Admitting: Obstetrics & Gynecology

## 2023-07-10 NOTE — Progress Notes (Signed)
   PRENATAL VISIT NOTE  Subjective:  Linda Kidd is a 28 y.o. G2P1001 at [redacted]w[redacted]d being seen today for ongoing prenatal care.  She is currently monitored for the following issues for this low-risk pregnancy and has Axillary hidradenitis suppurativa; History of cesarean delivery; Wrist pain; Supervision of other normal pregnancy, antepartum; Obesity in pregnancy; Low-lying placenta without hemorrhage, second trimester; and Echogenic intracardiac focus of fetus on prenatal ultrasound on their problem list.  Patient reports no complaints.  Contractions: Not present. Vag. Bleeding: None.  Movement: Present. Denies leaking of fluid.   The following portions of the patient's history were reviewed and updated as appropriate: allergies, current medications, past family history, past medical history, past social history, past surgical history and problem list.   Objective:    Vitals:   07/13/23 0914  BP: 116/81  Pulse: (!) 109  Weight: 164 lb (74.4 kg)    Fetal Status:  Fetal Heart Rate (bpm): 143 Fundal Height: 24 cm Movement: Present    General: Alert, oriented and cooperative. Patient is in no acute distress.  Skin: Skin is warm and dry. No rash noted.   Cardiovascular: Normal heart rate noted  Respiratory: Normal respiratory effort, no problems with respiration noted  Abdomen: Soft, gravid, appropriate for gestational age.  Pain/Pressure: Absent     Pelvic: Cervical exam deferred        Extremities: Normal range of motion.  Edema: None  Mental Status: Normal mood and affect. Normal behavior. Normal judgment and thought content.   Assessment and Plan:  Pregnancy: G2P1001 at [redacted]w[redacted]d 1. Supervision of other normal pregnancy, antepartum (Primary) - Doing well, feeling regular and vigorous fetal movement  2. History of cesarean delivery - Undecided on TOLAC vs. RCS. Counseled and resources provided in AVS.   3. [redacted] weeks gestation of pregnancy - Anticipatory guidance. Appropriate fundal  height.   Preterm labor symptoms and general obstetric precautions including but not limited to vaginal bleeding, contractions, leaking of fluid and fetal movement were reviewed in detail with the patient. Please refer to After Visit Summary for other counseling recommendations.   Return in about 4 weeks (around 08/10/2023) for LOB with GTT.  Future Appointments  Date Time Provider Department Center  07/28/2023  7:00 AM [facility] PROVIDER 1 [facility]  07/28/2023  7:30 AM [facility] US5 [facility]  08/10/2023  8:15 AM [facility] LAB [facility]  08/10/2023  8:35 AM Raynell Caller, MD [facility]    Salomon Cree, CNM

## 2023-07-12 ENCOUNTER — Ambulatory Visit: Admission: EM | Admit: 2023-07-12 | Discharge: 2023-07-12 | Disposition: A | Discharge status: Home / Self Care

## 2023-07-12 DIAGNOSIS — J069 Acute upper respiratory infection, unspecified: Secondary | ICD-10-CM

## 2023-07-12 DIAGNOSIS — Z3A24 24 weeks gestation of pregnancy: Secondary | ICD-10-CM | POA: Diagnosis not present

## 2023-07-12 NOTE — ED Triage Notes (Signed)
 Patient to Urgent Care with complaints of congested cough/ runny nose. Denies any fevers.  Reports symptoms started Sunday. Reports her daughter has pneumonia.   No otc meds.   24w 2d pregnant.

## 2023-07-12 NOTE — ED Provider Notes (Signed)
 Linda Kidd    CSN: [id number] Arrival date & time: 07/12/23  4098      History   Chief Complaint Chief Complaint  Patient presents with   Cough   Nasal Congestion    HPI Linda Kidd is a 28 y.o. female. Patient is [redacted]w[redacted]d pregnant. She presents with 3 day history of runny nose and cough.  No fever, shortness of breath, chest pain, abdominal pain.  No OTC medication taken today.   The history is provided by the patient and medical records.    Past Medical History:  Diagnosis Date   Medical history non-contributory     Patient Active Problem List   Diagnosis Date Noted   Echogenic intracardiac focus of fetus on prenatal ultrasound 06/17/2023   Low-lying placenta without hemorrhage, second trimester 06/16/2023   Supervision of other normal pregnancy, antepartum 05/18/2023   Obesity in pregnancy 05/18/2023   Wrist pain 06/30/2022   History of cesarean delivery 04/17/2022   Axillary hidradenitis suppurativa 03/12/2021    Past Surgical History:  Procedure Laterality Date   CESAREAN SECTION N/A 04/17/2022   Procedure: CESAREAN SECTION;  Surgeon: Wendelyn Halter, MD;  Location: [facility];  Service: Obstetrics;  Laterality: N/A;   NO PAST SURGERIES      OB History     Gravida  2   Para  1   Term  1   Preterm      AB      Living  1      SAB      IAB      Ectopic      Multiple  0   Live Births  1            Home Medications    Prior to Admission medications   Medication Sig Start Date End Date Taking? Authorizing Provider  aspirin  EC 81 MG tablet Take 1 tablet (81 mg total) by mouth daily. 05/18/23   Raynell Caller, MD  Prenatal Vit-Fe Fumarate-FA (PRENATAL VITAMINS PO) Take by mouth.    [provider]    Family History Family History  Problem Relation Age of Onset   Cervical cancer Mother    Diabetes Father     Social History Social History   Tobacco Use   Smoking status: Never   Smokeless tobacco: Never   Vaping Use   Vaping status: Never Used  Substance Use Topics   Alcohol use: Not Currently    Alcohol/week: 1.0 standard drink of alcohol    Types: 1 Glasses of wine per week    Comment: a drink a week on Fridays   Drug use: Never     Allergies   Acetaminophen -caffeine and Excedrin back & [acetaminophen -aspirin  buffered]   Review of Systems Review of Systems  Constitutional:  Negative for chills and fever.  HENT:  Positive for rhinorrhea. Negative for ear pain and sore throat.   Respiratory:  Positive for cough. Negative for shortness of breath.   Cardiovascular:  Negative for chest pain and palpitations.  Gastrointestinal:  Negative for abdominal pain.     Physical Exam Triage Vital Signs ED Triage Vitals [07/12/23 0904]  Encounter Vitals Group     BP 113/75     Systolic BP Percentile      Diastolic BP Percentile      Pulse Rate (!) 101     Resp 18     Temp 98.9 F (37.2 C)     Temp src  SpO2 98 %     Weight      Height      Head Circumference      Peak Flow      Pain Score      Pain Loc      Pain Education      Exclude from Growth Chart    No data found.  Updated Vital Signs BP 113/75   Pulse (!) 101   Temp 98.9 F (37.2 C)   Resp 18   LMP 01/23/2023 (Exact Date)   SpO2 98%   Visual Acuity Right Eye Distance:   Left Eye Distance:   Bilateral Distance:    Right Eye Near:   Left Eye Near:    Bilateral Near:     Physical Exam Constitutional:      General: She is not in acute distress. HENT:     Right Ear: Tympanic membrane normal.     Left Ear: Tympanic membrane normal.     Nose: Nose normal.     Mouth/Throat:     Mouth: Mucous membranes are moist.     Pharynx: Oropharynx is clear.  Cardiovascular:     Rate and Rhythm: Normal rate and regular rhythm.     Heart sounds: Normal heart sounds.  Pulmonary:     Effort: Pulmonary effort is normal. No respiratory distress.     Breath sounds: Normal breath sounds.  Neurological:     Mental  Status: She is alert.      UC Treatments / Results  Labs (all labs ordered are listed, but only abnormal results are displayed) Labs Reviewed - No data to display  EKG   Radiology No results found.  Procedures Procedures (including critical care time)  Medications Ordered in UC Medications - No data to display  Initial Impression / Assessment and Plan / UC Course  I have reviewed the triage vital signs and the nursing notes.  Pertinent labs & imaging results that were available during my care of the patient were reviewed by me and considered in my medical decision making (see chart for details).    [redacted] weeks pregnant, viral URI.  Afebrile and vital signs are stable.  Lungs are clear and O2 sat is 98% on room air.  Tylenol  as needed. Instructed patient to take OTC meds only as approved by her OB/GYN.  Instructed her to follow-up with her OB/GYN if her symptoms are not improving.  Education provided on viral URI and second trimester pregnancy.  She agrees to plan of care.  Final Clinical Impressions(s) / UC Diagnoses   Final diagnoses:  [redacted] weeks gestation of pregnancy  Viral URI     Discharge Instructions      Take over-the-counter medicines only as approved by your obstetrician.  Follow-up with your obstetrician if your symptoms are not improving.   ED Prescriptions   None    PDMP not reviewed this encounter.   Wellington Half, NP 07/12/23 [phone]

## 2023-07-12 NOTE — Discharge Instructions (Addendum)
 Take over-the-counter medicines only as approved by your obstetrician.  Follow-up with your obstetrician if your symptoms are not improving.

## 2023-07-13 ENCOUNTER — Ambulatory Visit (INDEPENDENT_AMBULATORY_CARE_PROVIDER_SITE_OTHER): Admitting: Certified Nurse Midwife

## 2023-07-13 VITALS — BP 116/81 | HR 109 | Wt 164.0 lb

## 2023-07-13 DIAGNOSIS — Z98891 History of uterine scar from previous surgery: Secondary | ICD-10-CM

## 2023-07-13 DIAGNOSIS — Z3480 Encounter for supervision of other normal pregnancy, unspecified trimester: Secondary | ICD-10-CM

## 2023-07-13 DIAGNOSIS — Z3A24 24 weeks gestation of pregnancy: Secondary | ICD-10-CM | POA: Diagnosis not present

## 2023-07-13 NOTE — Patient Instructions (Signed)
 Labor after Cesarean is a safe alternative to repeat (scheduled) cesarean birth for most people after 1 or 2 cesarean births.  - The success rate for VBAC (vaginal birth after cesarean) is approximately 76% after 1 cesarean and 71%-82% after 2 cesareans. - The risk of uterine rupture is approximately 0.47%-0.7% after 1 cesarean and 1.36% after 2 cesareans. - Generally similar outcomes for baby  Contraindications: - Previous classical incision (more likely if your cesarean birth was preterm) - Previous other surgeries on your uterus  - Conditions in which vaginal birth is otherwise not indicated  Success: You can increase your chance of success by going into labor naturally (not being induced). The best way is by helping to make sure your cervix is "ripe" or ready for labor.  - Eating 6-8 dates per day starting as early as 34 weeks can help soften and even dilate your cervix. You may eat them plain, in smoothies, or in energy balls.  They do contain sugar so eat use good judgment if you have diabetes and balance with protein.  - Sex can also help to ripen the cervix.  - The [employer] is a series of exercises to help get your baby into a good position.  - Cleaning the house on hands and knees - another good method to get baby into a good position - We can consider cervical sweeps to help ripen the cervix - 38-39 weeks.   More information:  Evidence Based Birth TOLAC Podcast and Article: https://www.choi-stevens.org/ The [employer] LAC Decision Tool: https://www.ontariomidwives.ca/sites/default/files/2021-06/Deciding-how-to-give-birth-after-caesarean-section-English.pdf

## 2023-07-13 NOTE — Progress Notes (Signed)
 ROB: denies any concerns

## 2023-07-28 ENCOUNTER — Ambulatory Visit: Attending: Maternal & Fetal Medicine

## 2023-07-28 ENCOUNTER — Other Ambulatory Visit: Payer: Self-pay | Admitting: *Deleted

## 2023-07-28 ENCOUNTER — Ambulatory Visit (HOSPITAL_BASED_OUTPATIENT_CLINIC_OR_DEPARTMENT_OTHER): Admitting: Obstetrics

## 2023-07-28 VITALS — BP 111/64 | HR 102

## 2023-07-28 DIAGNOSIS — Z98891 History of uterine scar from previous surgery: Secondary | ICD-10-CM

## 2023-07-28 DIAGNOSIS — O99212 Obesity complicating pregnancy, second trimester: Secondary | ICD-10-CM

## 2023-07-28 DIAGNOSIS — Z3A26 26 weeks gestation of pregnancy: Secondary | ICD-10-CM

## 2023-07-28 DIAGNOSIS — O283 Abnormal ultrasonic finding on antenatal screening of mother: Secondary | ICD-10-CM | POA: Diagnosis present

## 2023-07-28 DIAGNOSIS — O409XX0 Polyhydramnios, unspecified trimester, not applicable or unspecified: Secondary | ICD-10-CM

## 2023-07-28 DIAGNOSIS — O4442 Low lying placenta NOS or without hemorrhage, second trimester: Secondary | ICD-10-CM

## 2023-07-28 DIAGNOSIS — O99210 Obesity complicating pregnancy, unspecified trimester: Secondary | ICD-10-CM | POA: Insufficient documentation

## 2023-07-28 DIAGNOSIS — O34219 Maternal care for unspecified type scar from previous cesarean delivery: Secondary | ICD-10-CM

## 2023-07-28 DIAGNOSIS — O358XX0 Maternal care for other (suspected) fetal abnormality and damage, not applicable or unspecified: Secondary | ICD-10-CM

## 2023-07-28 DIAGNOSIS — E669 Obesity, unspecified: Secondary | ICD-10-CM

## 2023-07-28 NOTE — Progress Notes (Signed)
 MFM Consult Note  Linda Kidd is currently at 26 weeks and 4 days.  She has been followed due to maternal obesity with a BMI of 33.76 and a low-lying placenta that was noted during her last exam.  She denies any problems her last exam.  She has declined all screening tests for fetal aneuploidy.  On today's exam, the overall EFW of 2 pounds measures at the 23rd percentile for her gestational age.    Mild polyhydramnios with a maximal vertical pocket of 10.3 cm was noted today.  The previously noted low-lying placenta has resolved.  The patient was advised that she may attempt a vaginal delivery at term should she desire.    The echogenic focus continues to be noted on today's exam.  The association of an echogenic focus with Down syndrome was discussed.    Due to polyhydramnios, she should be screened for gestational diabetes at her next prenatal visit.    A follow-up exam was scheduled in 6 weeks.    The patient stated that all of her questions were answered today.  A total of 20 minutes was spent counseling and coordinating the care for this patient.  Greater than 50% of the time was spent in direct face-to-face contact.

## 2023-08-10 ENCOUNTER — Ambulatory Visit: Admitting: Obstetrics and Gynecology

## 2023-08-10 ENCOUNTER — Other Ambulatory Visit

## 2023-08-10 VITALS — BP 120/76 | HR 109 | Wt 171.0 lb

## 2023-08-10 DIAGNOSIS — Z3480 Encounter for supervision of other normal pregnancy, unspecified trimester: Secondary | ICD-10-CM

## 2023-08-10 DIAGNOSIS — O4442 Low lying placenta NOS or without hemorrhage, second trimester: Secondary | ICD-10-CM

## 2023-08-10 DIAGNOSIS — Z98891 History of uterine scar from previous surgery: Secondary | ICD-10-CM

## 2023-08-10 DIAGNOSIS — E669 Obesity, unspecified: Secondary | ICD-10-CM

## 2023-08-10 DIAGNOSIS — Z3A28 28 weeks gestation of pregnancy: Secondary | ICD-10-CM

## 2023-08-10 DIAGNOSIS — O283 Abnormal ultrasonic finding on antenatal screening of mother: Secondary | ICD-10-CM | POA: Diagnosis not present

## 2023-08-10 DIAGNOSIS — O403XX0 Polyhydramnios, third trimester, not applicable or unspecified: Secondary | ICD-10-CM | POA: Diagnosis not present

## 2023-08-10 DIAGNOSIS — O99210 Obesity complicating pregnancy, unspecified trimester: Secondary | ICD-10-CM

## 2023-08-10 NOTE — Progress Notes (Signed)
 ROB  Tdap Declined today. CC : None

## 2023-08-10 NOTE — Progress Notes (Signed)
   PRENATAL VISIT NOTE  Subjective:  Linda Kidd is a 28 y.o. G2P1001 at [redacted]w[redacted]d being seen today for ongoing prenatal care.  She is currently monitored for the following issues for this high-risk pregnancy and has Axillary hidradenitis suppurativa; History of cesarean delivery; Wrist pain; Supervision of other normal pregnancy, antepartum; Obesity in pregnancy; Low-lying placenta without hemorrhage, second trimester; Echogenic intracardiac focus of fetus on prenatal ultrasound; and Polyhydramnios affecting pregnancy in third trimester on their problem list.  Patient reports no complaints.  Contractions: Not present. Vag. Bleeding: None.  Movement: Present. Denies leaking of fluid.   The following portions of the patient's history were reviewed and updated as appropriate: allergies, current medications, past family history, past medical history, past social history, past surgical history and problem list.   Objective:    Vitals:   08/10/23 0832  BP: 120/76  Pulse: (!) 109  Weight: 171 lb (77.6 kg)    Fetal Status:  Fetal Heart Rate (bpm): 142   Movement: Present    General: Alert, oriented and cooperative. Patient is in no acute distress.  Skin: Skin is warm and dry. No rash noted.   Cardiovascular: Normal heart rate noted  Respiratory: Normal respiratory effort, no problems with respiration noted  Abdomen: Soft, gravid, appropriate for gestational age.  Pain/Pressure: Present     Pelvic: Cervical exam deferred        Extremities: Normal range of motion.  Edema: None  Mental Status: Normal mood and affect. Normal behavior. Normal judgment and thought content.   Assessment and Plan:  Pregnancy: G2P1001 at [redacted]w[redacted]d 1. Supervision of other normal pregnancy, antepartum (Primary) 28wk labs today  2. [redacted] weeks gestation of pregnancy  3. Echogenic intracardiac focus of fetus on prenatal ultrasound Seen on 6/6 scan along with poly. Patient states she's never had NIPT but is amenable to  it today. Panorama drawn today.  6/6: 23%, 899gm, ac 30%, poly 10.3 Follow up repeat  4. Polyhydramnios affecting pregnancy in third trimester See above. GTT today  5. History of cesarean delivery 03/2022 PLTCS for breech. Patient undecided on delivery mode. I d/w her re: tolac and rpt, including rupture risks. Pt to continue to consider  6. Low-lying placenta without hemorrhage, second trimester Resolved on 6/6 scan  7. Obesity in pregnancy Watch weight  8. Obesity (BMI 30-39.9)  Preterm labor symptoms and general obstetric precautions including but not limited to vaginal bleeding, contractions, leaking of fluid and fetal movement were reviewed in detail with the patient. Please refer to After Visit Summary for other counseling recommendations.   No follow-ups on file.  Future Appointments  Date Time Provider Department Center  09/08/2023  9:00 AM [facility] PROVIDER 1 [facility]  09/08/2023  9:30 AM [facility] US1 [facility]    Raynell Caller, MD

## 2023-08-11 ENCOUNTER — Encounter: Payer: Self-pay | Admitting: Obstetrics and Gynecology

## 2023-08-11 LAB — CBC
Hematocrit: 32.2 % — ABNORMAL LOW (ref 34.0–46.6)
Hemoglobin: 10.3 g/dL — ABNORMAL LOW (ref 11.1–15.9)
MCH: 30.1 pg (ref 26.6–33.0)
MCHC: 32 g/dL (ref 31.5–35.7)
MCV: 94 fL (ref 79–97)
Platelets: 161 10*3/uL (ref 150–450)
RBC: 3.42 x10E6/uL — ABNORMAL LOW (ref 3.77–5.28)
RDW: 13 % (ref 11.7–15.4)
WBC: 9 10*3/uL (ref 3.4–10.8)

## 2023-08-11 LAB — RPR: RPR Ser Ql: NONREACTIVE

## 2023-08-11 LAB — GLUCOSE TOLERANCE, 2 HOURS W/ 1HR
Glucose, 1 hour: 71 mg/dL (ref 70–179)
Glucose, 2 hour: 74 mg/dL (ref 70–152)
Glucose, Fasting: 81 mg/dL (ref 70–91)

## 2023-08-11 LAB — HIV ANTIBODY (ROUTINE TESTING W REFLEX): HIV Screen 4th Generation wRfx: NONREACTIVE

## 2023-08-14 ENCOUNTER — Ambulatory Visit: Payer: Self-pay | Admitting: Obstetrics and Gynecology

## 2023-08-14 DIAGNOSIS — O99013 Anemia complicating pregnancy, third trimester: Secondary | ICD-10-CM

## 2023-08-14 DIAGNOSIS — Z3480 Encounter for supervision of other normal pregnancy, unspecified trimester: Secondary | ICD-10-CM

## 2023-08-16 ENCOUNTER — Other Ambulatory Visit

## 2023-08-16 DIAGNOSIS — Z3480 Encounter for supervision of other normal pregnancy, unspecified trimester: Secondary | ICD-10-CM

## 2023-08-16 DIAGNOSIS — O99013 Anemia complicating pregnancy, third trimester: Secondary | ICD-10-CM

## 2023-08-16 LAB — PANORAMA PRENATAL TEST FULL PANEL:PANORAMA TEST PLUS 5 ADDITIONAL MICRODELETIONS: FETAL FRACTION: 18.1

## 2023-08-17 LAB — CBC/D/PLT+RPR+RH+ABO+RUBIGG...
Antibody Screen: NEGATIVE
Basophils Absolute: 0 10*3/uL (ref 0.0–0.2)
Basos: 0 %
EOS (ABSOLUTE): 0.1 10*3/uL (ref 0.0–0.4)
Eos: 1 %
HCV Ab: NONREACTIVE
HIV Screen 4th Generation wRfx: NONREACTIVE
Hematocrit: 29.7 % — ABNORMAL LOW (ref 34.0–46.6)
Hemoglobin: 9.8 g/dL — ABNORMAL LOW (ref 11.1–15.9)
Hepatitis B Surface Ag: NEGATIVE
Immature Grans (Abs): 0.1 10*3/uL (ref 0.0–0.1)
Immature Granulocytes: 1 %
Lymphocytes Absolute: 1.7 10*3/uL (ref 0.7–3.1)
Lymphs: 20 %
MCH: 30.2 pg (ref 26.6–33.0)
MCHC: 33 g/dL (ref 31.5–35.7)
MCV: 92 fL (ref 79–97)
Monocytes Absolute: 0.4 10*3/uL (ref 0.1–0.9)
Monocytes: 4 %
Neutrophils Absolute: 6.5 10*3/uL (ref 1.4–7.0)
Neutrophils: 74 %
Platelets: 171 10*3/uL (ref 150–450)
RBC: 3.24 x10E6/uL — ABNORMAL LOW (ref 3.77–5.28)
RDW: 12.8 % (ref 11.7–15.4)
RPR Ser Ql: NONREACTIVE
Rh Factor: POSITIVE
Rubella Antibodies, IGG: 2.15 {index} (ref >0.99)
WBC: 8.8 10*3/uL (ref 3.4–10.8)

## 2023-08-17 LAB — IRON AND TIBC
Iron Saturation: 8 % — CL (ref 15–55)
Iron: 32 ug/dL (ref 27–159)
Total Iron Binding Capacity: 419 ug/dL (ref 250–450)
UIBC: 387 ug/dL (ref 131–425)

## 2023-08-17 LAB — FERRITIN: Ferritin: 62 ng/mL (ref 15–150)

## 2023-08-17 LAB — B12 AND FOLATE PANEL
Folate: >20 ng/mL (ref >3.0)
Vitamin B-12: 272 pg/mL (ref 232–1245)

## 2023-08-17 LAB — HCV INTERPRETATION

## 2023-08-21 ENCOUNTER — Ambulatory Visit: Payer: Self-pay | Admitting: Obstetrics and Gynecology

## 2023-08-21 DIAGNOSIS — O99013 Anemia complicating pregnancy, third trimester: Secondary | ICD-10-CM

## 2023-08-23 ENCOUNTER — Ambulatory Visit (INDEPENDENT_AMBULATORY_CARE_PROVIDER_SITE_OTHER): Admitting: Obstetrics and Gynecology

## 2023-08-23 ENCOUNTER — Encounter: Admitting: Oncology

## 2023-08-23 VITALS — BP 116/77 | HR 116 | Wt 167.0 lb

## 2023-08-23 DIAGNOSIS — Z3480 Encounter for supervision of other normal pregnancy, unspecified trimester: Secondary | ICD-10-CM | POA: Diagnosis not present

## 2023-08-23 DIAGNOSIS — O99013 Anemia complicating pregnancy, third trimester: Secondary | ICD-10-CM | POA: Diagnosis not present

## 2023-08-23 DIAGNOSIS — Z3A30 30 weeks gestation of pregnancy: Secondary | ICD-10-CM | POA: Diagnosis not present

## 2023-08-23 NOTE — Progress Notes (Signed)
 ROB   CC: None

## 2023-08-23 NOTE — Progress Notes (Signed)
   LOW-RISK PREGNANCY OFFICE VISIT Patient name: Linda Kidd MRN [id number]  Date of birth: 05-14-1995 Chief Complaint:   Routine Prenatal Visit  History of Present Illness:   Linda Kidd is a 28 y.o. G31P1001 female at [redacted]w[redacted]d with an Estimated Date of Delivery: 10/30/23 being seen today for ongoing management of a low-risk pregnancy.  Today she reports no complaints. Contractions: Not present. Vag. Bleeding: None.  Movement: Present. denies leaking of fluid. Review of Systems:   Pertinent items are noted in HPI Denies abnormal vaginal discharge w/ itching/odor/irritation, headaches, visual changes, shortness of breath, chest pain, abdominal pain, severe nausea/vomiting, or problems with urination or bowel movements unless otherwise stated above. Pertinent History Reviewed:  Reviewed past medical,surgical, social, obstetrical and family history.  Reviewed problem list, medications and allergies. Physical Assessment:   Vitals:   08/23/23 1448  BP: 116/77  Pulse: (!) 116  Weight: 167 lb (75.8 kg)  Body mass index is 36.14 kg/m.        Physical Examination:   General appearance: Well appearing, and in no distress  Mental status: Alert, oriented to person, place, and time  Skin: Warm & dry  Cardiovascular: Normal heart rate noted  Respiratory: Normal respiratory effort, no distress  Abdomen: Soft, gravid, nontender  Pelvic: Cervical exam deferred         Extremities: Edema: None  Fetal Status: Fetal Heart Rate (bpm): 147 Fundal Height: 32 cm Movement: Present    No results found for this or any previous visit (from the past 24 hours).  Assessment & Plan:  1) Low-risk pregnancy G2P1001 at [redacted]w[redacted]d with an Estimated Date of Delivery: 10/30/23   2) Supervision of other normal pregnancy, antepartum (Primary) - Normal GTT on 08/10/23 - RTO in 2 wks  3) Anemia during pregnancy in third trimester - Taking iron  supplements  4) [redacted] weeks gestation of pregnancy     Meds: No orders  of the defined types were placed in this encounter.  Labs/procedures today: none  Plan:  Continue routine obstetrical care   Reviewed: Preterm labor symptoms and general obstetric precautions including but not limited to vaginal bleeding, contractions, leaking of fluid and fetal movement were reviewed in detail with the patient.  All questions were answered. Has home bp cuff. Check bp weekly, let us  know if >140/90.   Follow-up: Return in 15 days (on 09/07/2023) for Return OB visit.  No orders of the defined types were placed in this encounter.  Brand Siever MSN, CNM 08/23/2023 2:58 PM

## 2023-08-24 ENCOUNTER — Inpatient Hospital Stay: Admitting: Oncology

## 2023-08-24 ENCOUNTER — Inpatient Hospital Stay

## 2023-08-24 ENCOUNTER — Telehealth: Payer: Self-pay | Admitting: Oncology

## 2023-08-24 NOTE — Telephone Encounter (Signed)
 Called pt to make sure she was still coming to her appointment. She did not answer so I left a voicemail asking her to call me back if she was unable to come.

## 2023-09-07 ENCOUNTER — Inpatient Hospital Stay

## 2023-09-07 ENCOUNTER — Encounter: Payer: Self-pay | Admitting: Oncology

## 2023-09-07 ENCOUNTER — Inpatient Hospital Stay: Attending: Oncology | Admitting: Oncology

## 2023-09-07 ENCOUNTER — Ambulatory Visit: Admitting: Obstetrics & Gynecology

## 2023-09-07 ENCOUNTER — Encounter: Payer: Self-pay | Admitting: Obstetrics & Gynecology

## 2023-09-07 VITALS — BP 108/68 | Wt 168.0 lb

## 2023-09-07 DIAGNOSIS — O403XX0 Polyhydramnios, third trimester, not applicable or unspecified: Secondary | ICD-10-CM

## 2023-09-07 DIAGNOSIS — Z3A32 32 weeks gestation of pregnancy: Secondary | ICD-10-CM

## 2023-09-07 DIAGNOSIS — Z98891 History of uterine scar from previous surgery: Secondary | ICD-10-CM | POA: Diagnosis not present

## 2023-09-07 DIAGNOSIS — O99013 Anemia complicating pregnancy, third trimester: Secondary | ICD-10-CM | POA: Diagnosis present

## 2023-09-07 DIAGNOSIS — Z23 Encounter for immunization: Secondary | ICD-10-CM

## 2023-09-07 DIAGNOSIS — D509 Iron deficiency anemia, unspecified: Secondary | ICD-10-CM

## 2023-09-07 DIAGNOSIS — O0993 Supervision of high risk pregnancy, unspecified, third trimester: Secondary | ICD-10-CM

## 2023-09-07 NOTE — Progress Notes (Signed)
 [facility]  [phone] [phone]  ID: Linda Kidd OB: October 06, 1995  MR#: [id number]  RDW#:[id number]  Patient Care Team: Patient, No Pcp Per as PCP - General (General Practice)  CHIEF COMPLAINT: Iron  deficiency in third trimester pregnancy.  INTERVAL HISTORY: Patient is a 28 year old female in the third trimester of her second pregnancy is noted to have declining hemoglobin and iron  stores.  She currently feels well is asymptomatic.  She does not complain of any weakness or fatigue.  She is gaining weight appropriately.  She has no neurologic complaints.  She denies any recent fevers or illnesses.  She has no chest pain, shortness of breath, cough, or hemoptysis.  She denies any nausea, vomiting, constipation, or diarrhea.  She has no melena or hematochezia.  She has no urinary complaints.  Patient offers no further specific complaints today.  REVIEW OF SYSTEMS:   Review of Systems  Constitutional: Negative.  Negative for fever, malaise/fatigue and weight loss.  Respiratory: Negative.  Negative for cough, hemoptysis and shortness of breath.   Cardiovascular: Negative.  Negative for chest pain and leg swelling.  Gastrointestinal: Negative.  Negative for abdominal pain, blood in stool and melena.  Genitourinary: Negative.  Negative for dysuria.  Musculoskeletal: Negative.  Negative for myalgias.  Skin: Negative.  Negative for rash.  Neurological: Negative.  Negative for dizziness, focal weakness, weakness and headaches.  Psychiatric/Behavioral: Negative.  The patient is not nervous/anxious.     As per HPI. Otherwise, a complete review of systems is negative.  PAST MEDICAL HISTORY: Past Medical History:  Diagnosis Date   Medical history non-contributory     PAST SURGICAL HISTORY: Past Surgical History:  Procedure Laterality Date   CESAREAN SECTION N/A 04/17/2022   Procedure: CESAREAN SECTION;  Surgeon: Jayne Vonn DEL, MD;  Location: [facility];  Service: Obstetrics;  Laterality: N/A;   NO PAST SURGERIES      FAMILY HISTORY: Family History  Problem Relation Age of Onset   Cervical cancer Mother    Diabetes Father     ADVANCED DIRECTIVES (Y/N):  N  HEALTH MAINTENANCE: Social History   Tobacco Use   Smoking status: Never   Smokeless tobacco: Never  Vaping Use   Vaping status: Never Used  Substance Use Topics   Alcohol use: Not Currently    Alcohol/week: 1.0 standard drink of alcohol    Types: 1 Glasses of wine per week    Comment: a drink a week on Fridays   Drug use: Never     Colonoscopy:  PAP:  Bone density:  Lipid panel:  Allergies  Allergen Reactions   Excedrin Back & [Acetaminophen -Aspirin  Buffered] Other (See Comments)    Gi burning    Current Outpatient Medications  Medication Sig Dispense Refill   Prenatal Vit-Fe Fumarate-FA (PRENATAL VITAMINS PO) Take by mouth.     aspirin  EC 81 MG tablet Take 1 tablet (81 mg total) by mouth daily. (Patient not taking: Reported on 09/07/2023) 60 tablet 1   No current facility-administered medications for this visit.    OBJECTIVE: There were no vitals filed for this visit.   There is no height or weight on file to calculate BMI.    ECOG FS:0 - Asymptomatic  General: Well-developed, well-nourished, no acute distress. Eyes: Pink conjunctiva, anicteric sclera. HEENT: Normocephalic, moist mucous membranes. Lungs: No audible wheezing or coughing. Heart: Regular rate and rhythm. Abdomen: Appears appropriate for gestational age. Musculoskeletal: No edema, cyanosis, or clubbing. Neuro: Alert, answering all questions appropriately.  Cranial nerves grossly intact. Skin: No rashes or petechiae noted. Psych: Normal affect. Lymphatics: No cervical, calvicular, axillary or inguinal LAD.   LAB RESULTS:  Lab Results  Component Value Date   NA 137 03/12/2021   K 3.7 03/12/2021   CL 104 03/12/2021   CO2 28 03/12/2021   GLUCOSE 96 03/12/2021   BUN 13 03/12/2021    CREATININE 0.72 03/12/2021   CALCIUM 9.2 03/12/2021   PROT 7.9 03/12/2021   ALBUMIN 4.5 03/12/2021   AST 19 03/12/2021   ALT 9 03/12/2021   ALKPHOS 50 03/12/2021   BILITOT 0.5 03/12/2021   GFRNONAA >60 09/27/2015   GFRAA 144 03/12/2019    Lab Results  Component Value Date   WBC 8.8 08/16/2023   NEUTROABS 6.5 08/16/2023   HGB 9.8 (L) 08/16/2023   HCT 29.7 (L) 08/16/2023   MCV 92 08/16/2023   PLT 171 08/16/2023   Lab Results  Component Value Date   FERRITIN 62 08/16/2023   Lab Results  Component Value Date   IRON  32 08/16/2023   TIBC 419 08/16/2023   IRONPCTSAT 8 (LL) 08/16/2023     STUDIES: No results found.  ASSESSMENT: Iron  deficiency in third trimester pregnancy.  PLAN:    Iron  deficiency in third trimester pregnancy: Patient is noted to have a declining hemoglobin and iron  stores.  She also reports she required IV iron  postpartum with her first pregnancy.  Patient would benefit from 200 mg IV Venofer  and will return to clinic 4 times over the next 2 weeks to receive treatment.  She will then return to clinic the first week of September prior to her due date for repeat laboratory work, further evaluation, and consideration of additional treatment if necessary. Pregnancy: Patient reports  she is having a boy and due date is approximately October 30, 2023.  She is unclear if she will have a natural birth or C-section.  I spent a total of 45 minutes reviewing chart data, face-to-face evaluation with the patient, counseling and coordination of care as detailed above.   Patient expressed understanding and was in agreement with this plan. She also understands that She can call clinic at any time with any questions, concerns, or complaints.     Linda JINNY Reusing, MD   09/07/2023 1:07 PM

## 2023-09-07 NOTE — Progress Notes (Signed)
 PRENATAL VISIT NOTE  Subjective:  Linda Kidd is a 28 y.o. G2P1001 at [redacted]w[redacted]d being seen today for ongoing prenatal care.  She is currently monitored for the following issues for this high-risk pregnancy and has Axillary hidradenitis suppurativa; Maternal iron  deficiency anemia complicating pregnancy in third trimester; History of cesarean delivery; Wrist pain; Supervision of high-risk pregnancy; Obesity in pregnancy; Echogenic intracardiac focus of fetus on prenatal ultrasound; and Polyhydramnios affecting pregnancy in third trimester on their problem list.  Patient reports no complaints.  Contractions: Not present (Unsure). Vag. Bleeding: None.  Movement: Present. Denies leaking of fluid.   The following portions of the patient's history were reviewed and updated as appropriate: allergies, current medications, past family history, past medical history, past social history, past surgical history and problem list.   Objective:    Vitals:   09/07/23 1515  BP: 108/68  Weight: 168 lb (76.2 kg)    Fetal Status:  Fetal Heart Rate (bpm): 137   Movement: Present    General: Alert, oriented and cooperative. Patient is in no acute distress.  Skin: Skin is warm and dry. No rash noted.   Cardiovascular: Normal heart rate noted  Respiratory: Normal respiratory effort, no problems with respiration noted  Abdomen: Soft, gravid, appropriate for gestational age.  Pain/Pressure: Present     Pelvic: Cervical exam deferred        Extremities: Normal range of motion.  Edema: None  Mental Status: Normal mood and affect. Normal behavior. Normal judgment and thought content.   US  MFM OB FOLLOW UP Result Date: 07/28/2023 ----------------------------------------------------------------------  OBSTETRICS REPORT                       (Signed Final 07/28/2023 08:47 am) ---------------------------------------------------------------------- Patient Info  ID #:       [id number]                          D.O.B.:   02-Apr-1995 (28 yrs)(F)  Name:       Linda Kidd                Visit Date: 07/28/2023 07:53 am ---------------------------------------------------------------------- Performed By  Attending:        Steffan Keys MD         Ref. Address:     [address]                                                             [address]  Performed By:     Cosette Mor         [facility]  Referred By:      [facility] Correne Beagle ---------------------------------------------------------------------- Orders  #  Description                           Code        Ordered By  1  US  MFM OB FOLLOW UP                   23183.98    DELORA SMALLER ----------------------------------------------------------------------  #  Order #                     Accession #                Episode #  1  [id number]                   [id number]                 [id number] ---------------------------------------------------------------------- Indications  Obesity complicating pregnancy, second         O99.212  trimester (BMI 33)  Echogenic intracardiac focus of the heart      O35.8XX0  (EIF)  Polyhydramnios, second trimester               O40.2XX1  Low lying placenta, antepartum (RESOLVED)      O44.40  [redacted] weeks gestation of pregnancy                Z3A.26  History of cesarean delivery, currently        O34.219  pregnant  Encounter for antenatal screening for          Z36.3  malformations  Declined Genetic testing ---------------------------------------------------------------------- Vital Signs  BP:          111/64 ---------------------------------------------------------------------- Fetal Evaluation  Num Of Fetuses:         1  Fetal Heart Rate(bpm):  145  Cardiac Activity:       Observed  Presentation:           Breech  Placenta:                Anterior  P. Cord Insertion:      Visualized  Amniotic Fluid  AFI FV:      Polyhydramnios                              Largest Pocket(cm)                              10.39 ---------------------------------------------------------------------- Biometry  BPD:      67.4  mm     G. Age:  27w 1d         59  %    CI:        73.75   %    70 - 86  FL/HC:      18.9   %    18.6 - 20.4  HC:      249.3  mm     G. Age:  27w 0d         40  %    HC/AC:      1.15        1.05 - 1.21  AC:      217.5  mm     G. Age:  26w 2d         30  %    FL/BPD:     69.7   %    71 - 87  FL:         47  mm     G. Age:  25w 5d         13  %    FL/AC:      21.6   %    20 - 24  LV:        4.9  mm  Est. FW:     899  gm           2 lb     23  % ---------------------------------------------------------------------- OB History  Gravidity:    2         Term:   1  Living:       1 ---------------------------------------------------------------------- Gestational Age  LMP:           26w 4d        Date:  01/23/23                  EDD:   10/30/23  U/S Today:     26w 4d                                        EDD:   10/30/23  Best:          26w 4d     Det. By:  LMP  (01/23/23)          EDD:   10/30/23 ---------------------------------------------------------------------- Anatomy  Ventricles:            Appears normal         Stomach:                Appears normal, left                                                                        sided  Heart:                 Appears normal         Kidneys:                Appear normal                         (EIF)  Diaphragm:             Appears normal         Bladder:  Appears normal ---------------------------------------------------------------------- Comments  Linda Kidd is currently at 26 weeks and 4 days.  She has  been followed due to maternal obesity with a BMI of 33.76  and a low-lying placenta that was noted during her last exam.   She denies any problems her last exam.  She has declined all screening tests for fetal aneuploidy.  On today's exam, the overall EFW of 2 pounds measures at  the 23rd percentile for her gestational age.  Mild polyhydramnios with a maximal vertical pocket of 10.3  cm was noted today.  The previously noted low-lying placenta has resolved.  The  patient was advised that she may attempt a vaginal delivery  at term should she desire.  The echogenic focus continues to be noted on today's exam.  The association of an echogenic focus with Down syndrome  was discussed.  Due to polyhydramnios, she should be screened for  gestational diabetes at her next prenatal visit.  A follow-up exam was scheduled in 6 weeks.  The patient stated that all of her questions were answered  today.  A total of 20 minutes was spent counseling and coordinating  the care for this patient.  Greater than 50% of the time was  spent in direct face-to-face contact. ----------------------------------------------------------------------                  Steffan Keys, MD Electronically Signed Final Report   07/28/2023 08:47 am ----------------------------------------------------------------------   US  MFM OB DETAIL +14 WK Result Date: 06/16/2023 ----------------------------------------------------------------------  OBSTETRICS REPORT                    (Corrected Final 06/16/2023 02:13 pm) ---------------------------------------------------------------------- Patient Info  ID #:       [id number]                          D.O.B.:  1995/07/10 (28 yrs)(F)  Name:       Linda Kidd                Visit Date: 06/16/2023 08:10 am ---------------------------------------------------------------------- Performed By  Attending:        Delora Smaller DO       Ref. Address:     [address]                                                             [address]  Performed By:     Joyann Allen RDMS      Location[facility]  Referred By:      [facility] ---------------------------------------------------------------------- Orders  #  Description                           Code        Ordered By  1  US  MFM OB DETAIL +14 WK               [id number]    BEBE FURRY ----------------------------------------------------------------------  #  Order #                     Accession #                Episode #  1  [id number]                   [id number]                 [id number] ---------------------------------------------------------------------- Indications  Obesity complicating pregnancy, second         O99.212  trimester (BMI 33)  Low lying placenta, antepartum                 O44.40  History of cesarean delivery, currently        O34.219  pregnant  [redacted] weeks gestation of pregnancy                Z3A.20  Encounter for antenatal screening for          Z36.3  malformations  Declined Genetic testing  Echogenic intracardiac focus of the heart      O35.8XX0  (EIF) ---------------------------------------------------------------------- Vital Signs  BP:          114/68 ---------------------------------------------------------------------- Fetal Evaluation  Num Of Fetuses:         1  Fetal Heart Rate(bpm):  140  Cardiac Activity:       Observed  Presentation:           Variable  Placenta:               Anterior, low-lying, 0.8 cm from int os  P. Cord Insertion:      Visualized, central  Amniotic Fluid  AFI FV:      Within normal limits ---------------------------------------------------------------------- Biometry  BPD:      46.6  mm     G. Age:  20w 1d         29  %    CI:        73.06   %    70 - 86                                                          FL/HC:      18.5   %    15.9 - 20.3  HC:      173.3  mm     G. Age:  19w 6d  14  %    HC/AC:      1.18         1.06 - 1.25  AC:      147.4  mm     G. Age:  20w 0d         26  %    FL/BPD:     68.7   %  FL:         32  mm     G. Age:  20w 0d         22  %    FL/AC:      21.7   %    20 - 24  HUM:      29.5  mm     G. Age:  19w 5d         25  %  CER:      20.2  mm     G. Age:  19w 3d         29  %  LV:        5.5  mm  CM:        3.9  mm  Est. FW:     325  gm    0 lb 11 oz      18  % ---------------------------------------------------------------------- OB History  Gravidity:    2         Term:   1  Living:       1 ---------------------------------------------------------------------- Gestational Age  LMP:           20w 4d        Date:  01/23/23                  EDD:   10/30/23  U/S Today:     20w 0d                                        EDD:   11/03/23  Best:          20w 4d     Det. By:  LMP  (01/23/23)          EDD:   10/30/23 ---------------------------------------------------------------------- Targeted Anatomy  Central Nervous System  Calvarium/Cranial V.:  Appears normal         Cereb./Vermis:          Appears normal  Cavum:                 Appears normal         Cisterna Magna:         Appears normal  Lateral Ventricles:    Appears normal         Midline Falx:           Appears normal  Choroid Plexus:        Appears normal  Spine  Cervical:              Appears normal         Sacral:                 Appears normal  Thoracic:              Appears normal         Shape/Curvature:        Appears normal  Lumbar:  Appears normal  Head/Neck  Lips:                  Appears normal         Profile:                Appears normal  Neck:                  Appears normal         Orbits/Eyes:            Appears normal  Nuchal Fold:           Appears normal         Mandible:               Appears normal  Nasal Bone:            Present                Maxilla:                Appears normal  Thorax  4 Chamber View:        Not well vis; EIF      Interventr. Septum:     Appears normal  Cardiac Rhythm:        Normal                  Cardiac Axis:           Normal  Cardiac Situs:         Appears normal         Diaphragm:              Appears normal  Rt Outflow Tract:      Appears normal         3 Vessel View:          Appears normal  Lt Outflow Tract:      Appears normal         3 V Trachea View:       Appears normal  Aortic Arch:           Appears normal         IVC:                    Appears normal  Ductal Arch:           Appears normal         Crossing:               Appears normal  SVC:                   Appears normal  Abdomen  Ventral Wall:          Appears normal         Lt Kidney:              Appears normal  Cord Insertion:        Appears normal         Rt Kidney:              Appears normal  Situs:                 Appears normal         Bladder:                Appears normal  Stomach:  Appears normal  Extremities  Lt Humerus:            Appears normal         Lt Femur:               Appears normal  Rt Humerus:            Appears normal         Rt Femur:               Appears normal  Lt Forearm:            Appears normal         Lt Lower Leg:           Appears normal  Rt Forearm:            Appears normal         Rt Lower Leg:           Appears normal  Lt Hand:               Open hand nml          Lt Foot:                Nml heel/foot  Rt Hand:               Open hand nml          Rt Foot:                Nml heel/foot  Other  Umbilical Cord:        Normal 3-vessel        Genitalia:              Female-nml ---------------------------------------------------------------------- Cervix Uterus Adnexa  Cervix  Length:              3  cm.  Normal appearance by transabdominal scan  Right Ovary  Not visualized.  Left Ovary  Size(cm)     2.99   x   2.31   x  1.47      Vol(ml): 5.32  Within normal limits.  Cul De Sac  No free fluid seen.  Adnexa  No abnormality visualized ---------------------------------------------------------------------- Comments  MFM Consult Note  Linda Kidd has a pregnancy with the complications  mentioned  in the problem list. During today's visit we focused  on the following concerns:  RE prior cesarean: Patient is considering a trial of labor  versus a scheduled cesarean delivery.  At this time she is  favoring an actual vaginal delivery but her final decision will  be made by pending pregnancy.  The placenta is anterior and  will be reassessed at future visits but today there is no  evidence of placenta accreta or abnormal placentation.  RE low lying placenta: The placenta is approximately 0.8 cm  away from the internal os.  This is likely to resolve prior to  delivery.  Will reassess in about 6 to 8 weeks.  RE echogenic intracardiac focus: There is an echogenic  focus noted within the left ventricle.  I discussed this is a soft  marker that is associate with Down syndrome.  This likely  doubles the patient's age-related risk which is low overall  approximately 1 and 990 based on her age but after the  ultrasound the risk is 1 out of 354 due to the presence of the  echogenic intracardiac focus.  The patient has not elected to  have  NIPT testing.  I encouraged her to discuss this with her  OB provider.  If she has low risk aneuploidy screening then  amniocentesis is not necessary.  I also discussed  amniocentesis as the only method of achieving a diagnostic  result.  She declined at this time and will talk to her OB  provider about noninvasive screening methods  Sonographic findings  Single intrauterine pregnancy at 20w 4d.  Fetal cardiac activity:  Observed and appears normal.  Presentation: Variable.  The anatomic structures that were well seen appear normal  except for an EIF. The anatomic survey is complete.  Fetal biometry shows the estimated fetal weight at the 18  percentile.  Amniotic fluid: Within normal limits.  Placenta: Anterior, low-lying, 0.8 cm from int os.  Adnexa: No abnormality visualized.  Cervical length: 3 cm.  There are limitations of prenatal ultrasound such as the  inability to detect certain  abnormalities due to poor  visualization. Various factors such as fetal position,  gestational age and maternal body habitus may increase the  difficulty in visualizing the fetal anatomy.  Recommendations  -EDD should be 10/30/2023 based on  LMP (01/23/23).  -Follow up ultrasound in 6 weeks to reassess the fetal growth.  -Detailed ultrasound was done today without abnormalities.  A low-lying placenta was observed and will be assessed in  the future but is likely to resolve.  -Follow-up anatomy and fetal growth in 6 weeks  -The patient will discuss with her OB provider regarding  aneuploidy screening due to the concern echogenic  intracardiac  -Continue routine prenatal care with referring OB provider ----------------------------------------------------------------------                       Delora Smaller, DO Electronically Signed Corrected Final Report  06/16/2023 02:13 pm ----------------------------------------------------------------------    Assessment and Plan:  Pregnancy: G2P1001 at [redacted]w[redacted]d 1. Maternal iron  deficiency anemia complicating pregnancy in third trimester (Primary) Had hematology consult today, scheduled for Venofer  infusions.  2. Polyhydramnios affecting pregnancy in third trimester Follow up scan on 09/08/23, will follow up results and manage accordingly.  3. History of cesarean delivery Had last cesarean section in February 2024.  Discussed that this is a close interpregnancy interval, uterine rupture risk is increased.  Discussed risks and benefits of TOLAC and RCS in detail, sequelae of the rare occurrence of uterine rupture.  All questions answered.  She desires TOLAC, consent signed.  4. Need for Tdap vaccination - Tdap vaccine greater than or equal to 7yo IM given today.  5. [redacted] weeks gestation of pregnancy 6. Supervision of high risk pregnancy in third trimester Preterm labor symptoms and general obstetric precautions including but not limited to vaginal bleeding,  contractions, leaking of fluid and fetal movement were reviewed in detail with the patient. Please refer to After Visit Summary for other counseling recommendations.   Return in about 2 weeks (around 09/21/2023) for OFFICE OB VISIT (MD only).  Future Appointments  Date Time Provider Department Center  09/08/2023  9:00 AM [facility] PROVIDER 1 [facility]  09/08/2023  9:30 AM [facility] US7 [facility]  09/11/2023 10:00 AM [facility]FUSION CHAIR 17 [facility] None  09/14/2023 10:00 AM [facility] INFUSION CHAIR 9 [facility] None  09/18/2023 10:00 AM [facility] INFUSION CHAIR 14 [facility] None  09/21/2023 10:00 AM [facility] INFUSION CHAIR 16 [facility] None  09/21/2023  1:30 PM Fredirick Glenys RAMAN, MD [facility]  10/05/2023  9:35 AM Fredirick Glenys RAMAN, MD [facility]  10/12/2023 11:15 AM Constant, Winton, MD [facility]  10/19/2023  8:55 AM Bama Hanselman, Gloris LABOR, MD [facility]  10/25/2023 10:00 AM [facility] LAB [facility] None  10/26/2023 10:30 AM Jacobo Evalene PARAS, MD [facility] None  10/26/2023 11:00 AM [facility] INFUSION CHAIR 10 [facility] None    Gloris Hugger, MD

## 2023-09-07 NOTE — Progress Notes (Signed)
 Patient is pregnant, she is due in September. She isn't really having any symptoms right now, but she was anemic with her first child, a year ago, and they gave her an iron  infusion after having the baby and she said that she handled/tolerate the iron  well, but when trying to take the iron  supplements they made her severely sick.

## 2023-09-08 ENCOUNTER — Ambulatory Visit

## 2023-09-08 ENCOUNTER — Other Ambulatory Visit: Payer: Self-pay | Admitting: *Deleted

## 2023-09-08 ENCOUNTER — Ambulatory Visit: Attending: Obstetrics | Admitting: Obstetrics

## 2023-09-08 VITALS — BP 114/59 | HR 113

## 2023-09-08 DIAGNOSIS — Z3A32 32 weeks gestation of pregnancy: Secondary | ICD-10-CM

## 2023-09-08 DIAGNOSIS — Z3480 Encounter for supervision of other normal pregnancy, unspecified trimester: Secondary | ICD-10-CM

## 2023-09-08 DIAGNOSIS — O99013 Anemia complicating pregnancy, third trimester: Secondary | ICD-10-CM | POA: Insufficient documentation

## 2023-09-08 DIAGNOSIS — E669 Obesity, unspecified: Secondary | ICD-10-CM

## 2023-09-08 DIAGNOSIS — Z362 Encounter for other antenatal screening follow-up: Secondary | ICD-10-CM | POA: Diagnosis not present

## 2023-09-08 DIAGNOSIS — O283 Abnormal ultrasonic finding on antenatal screening of mother: Secondary | ICD-10-CM

## 2023-09-08 DIAGNOSIS — O403XX0 Polyhydramnios, third trimester, not applicable or unspecified: Secondary | ICD-10-CM | POA: Diagnosis present

## 2023-09-08 DIAGNOSIS — O409XX0 Polyhydramnios, unspecified trimester, not applicable or unspecified: Secondary | ICD-10-CM

## 2023-09-08 DIAGNOSIS — D649 Anemia, unspecified: Secondary | ICD-10-CM

## 2023-09-08 DIAGNOSIS — O99210 Obesity complicating pregnancy, unspecified trimester: Secondary | ICD-10-CM

## 2023-09-08 DIAGNOSIS — O34219 Maternal care for unspecified type scar from previous cesarean delivery: Secondary | ICD-10-CM

## 2023-09-08 DIAGNOSIS — O99213 Obesity complicating pregnancy, third trimester: Secondary | ICD-10-CM | POA: Insufficient documentation

## 2023-09-08 DIAGNOSIS — Z98891 History of uterine scar from previous surgery: Secondary | ICD-10-CM

## 2023-09-08 NOTE — Progress Notes (Signed)
 MFM Consult Note  Linda Kidd is currently at 32 weeks and 4 days.  She has been followed due to maternal obesity with a BMI of 33.76 and polyhydramnios noted on her prior exam.    She denies any problems her last exam and has screened negative for gestational diabetes in her current pregnancy.  On today's exam, the overall EFW of 4 pounds 6 ounces measures at the 36th percentile for her gestational age.    Mild polyhydramnios continues to be noted with a total AFI of 28.4 cm.  The patient was advised that the cause of her polyhydramnios remains undetermined.    Due to polyhydramnios, a follow-up exam was scheduled in 4 weeks.  The patient stated that all of her questions were answered today.  A total of 10 minutes was spent counseling and coordinating the care for this patient.  Greater than 50% of the time was spent in direct face-to-face contact.

## 2023-09-11 ENCOUNTER — Inpatient Hospital Stay

## 2023-09-14 ENCOUNTER — Inpatient Hospital Stay

## 2023-09-14 VITALS — BP 105/71 | HR 107 | Temp 96.0°F | Resp 18

## 2023-09-14 DIAGNOSIS — D509 Iron deficiency anemia, unspecified: Secondary | ICD-10-CM

## 2023-09-14 DIAGNOSIS — O99013 Anemia complicating pregnancy, third trimester: Secondary | ICD-10-CM | POA: Diagnosis not present

## 2023-09-14 MED ORDER — IRON SUCROSE 20 MG/ML IV SOLN
200.0000 mg | Freq: Once | INTRAVENOUS | Status: AC
  Administered 2023-09-14: 200 mg via INTRAVENOUS
  Filled 2023-09-14: qty 10

## 2023-09-14 MED ORDER — SODIUM CHLORIDE 0.9% FLUSH
10.0000 mL | Freq: Once | INTRAVENOUS | Status: AC | PRN
  Administered 2023-09-14: 10 mL
  Filled 2023-09-14: qty 10

## 2023-09-18 ENCOUNTER — Inpatient Hospital Stay

## 2023-09-18 VITALS — BP 108/66 | HR 105 | Temp 97.0°F | Resp 18

## 2023-09-18 DIAGNOSIS — O99013 Anemia complicating pregnancy, third trimester: Secondary | ICD-10-CM | POA: Diagnosis not present

## 2023-09-18 MED ORDER — SODIUM CHLORIDE 0.9% FLUSH
10.0000 mL | Freq: Once | INTRAVENOUS | Status: AC | PRN
  Administered 2023-09-18: 10 mL
  Filled 2023-09-18: qty 10

## 2023-09-18 MED ORDER — IRON SUCROSE 20 MG/ML IV SOLN
200.0000 mg | Freq: Once | INTRAVENOUS | Status: AC
  Administered 2023-09-18: 200 mg via INTRAVENOUS
  Filled 2023-09-18: qty 10

## 2023-09-21 ENCOUNTER — Ambulatory Visit (INDEPENDENT_AMBULATORY_CARE_PROVIDER_SITE_OTHER): Admitting: Family Medicine

## 2023-09-21 ENCOUNTER — Inpatient Hospital Stay

## 2023-09-21 VITALS — BP 108/66 | HR 109 | Temp 97.4°F | Resp 18

## 2023-09-21 VITALS — BP 116/69 | HR 112 | Wt 170.0 lb

## 2023-09-21 DIAGNOSIS — D509 Iron deficiency anemia, unspecified: Secondary | ICD-10-CM

## 2023-09-21 DIAGNOSIS — Z98891 History of uterine scar from previous surgery: Secondary | ICD-10-CM | POA: Diagnosis not present

## 2023-09-21 DIAGNOSIS — O0993 Supervision of high risk pregnancy, unspecified, third trimester: Secondary | ICD-10-CM

## 2023-09-21 DIAGNOSIS — O99013 Anemia complicating pregnancy, third trimester: Secondary | ICD-10-CM | POA: Diagnosis not present

## 2023-09-21 DIAGNOSIS — Z3A34 34 weeks gestation of pregnancy: Secondary | ICD-10-CM

## 2023-09-21 DIAGNOSIS — O403XX0 Polyhydramnios, third trimester, not applicable or unspecified: Secondary | ICD-10-CM

## 2023-09-21 MED ORDER — IRON SUCROSE 20 MG/ML IV SOLN
200.0000 mg | Freq: Once | INTRAVENOUS | Status: AC
  Administered 2023-09-21: 200 mg via INTRAVENOUS
  Filled 2023-09-21: qty 10

## 2023-09-21 MED ORDER — SODIUM CHLORIDE 0.9% FLUSH
10.0000 mL | Freq: Once | INTRAVENOUS | Status: AC | PRN
Start: 2023-09-21 — End: 2023-09-21
  Administered 2023-09-21: 10 mL
  Filled 2023-09-21: qty 10

## 2023-09-21 NOTE — Progress Notes (Signed)
 ROB: Increased pressure  Braxton hicks contractions

## 2023-09-21 NOTE — Progress Notes (Signed)
   PRENATAL VISIT NOTE  Subjective:  Linda Kidd is a 28 y.o. G2P1001 at [redacted]w[redacted]d being seen today for ongoing prenatal care.  She is currently monitored for the following issues for this low-risk pregnancy and has Axillary hidradenitis suppurativa; Maternal iron  deficiency anemia complicating pregnancy in third trimester; History of cesarean delivery; Wrist pain; Supervision of high-risk pregnancy; Obesity in pregnancy; Echogenic intracardiac focus of fetus on prenatal ultrasound; and Polyhydramnios affecting pregnancy in third trimester on their problem list.  Patient reports no complaints.  Contractions: Irregular. Vag. Bleeding: None.  Movement: Present. Denies leaking of fluid.   The following portions of the patient's history were reviewed and updated as appropriate: allergies, current medications, past family history, past medical history, past social history, past surgical history and problem list.   Objective:    Vitals:   09/21/23 1327  BP: 116/69  Pulse: (!) 112  Weight: 170 lb (77.1 kg)    Fetal Status:  Fetal Heart Rate (bpm): 140   Movement: Present    General: Alert, oriented and cooperative. Patient is in no acute distress.  Skin: Skin is warm and dry. No rash noted.   Cardiovascular: Normal heart rate noted  Respiratory: Normal respiratory effort, no problems with respiration noted  Abdomen: Soft, gravid, appropriate for gestational age.  Pain/Pressure: Present     Pelvic: Cervical exam deferred        Extremities: Normal range of motion.  Edema: Trace  Mental Status: Normal mood and affect. Normal behavior. Normal judgment and thought content.   Assessment and Plan:  Pregnancy: G2P1001 at [redacted]w[redacted]d 1. Supervision of high risk pregnancy in third trimester (Primary) BH ctx's, discussed when to call us   2. History of cesarean delivery For TOLAC, consent signed  3. Maternal iron  deficiency anemia complicating pregnancy in third trimester Getting infusions  4.  Polyhydramnios affecting pregnancy in third trimester Q 4 weeks, u/s for growth, last 36%  5. [redacted] weeks gestation of pregnancy   Preterm labor symptoms and general obstetric precautions including but not limited to vaginal bleeding, contractions, leaking of fluid and fetal movement were reviewed in detail with the patient. Please refer to After Visit Summary for other counseling recommendations.   Return in 2 weeks (on 10/05/2023).  Future Appointments  Date Time Provider Department Center  10/05/2023  9:35 AM Fredirick Glenys RAMAN, MD [facility]  10/05/2023  2:00 PM [facility] PROVIDER 1 [facility]  10/05/2023  2:15 PM [facility] US2 [facility]  10/12/2023 11:15 AM Constant, Winton, MD [facility]  10/19/2023  8:55 AM Anyanwu, Gloris LABOR, MD [facility]  10/25/2023 10:00 AM [facility] None  10/26/2023 10:30 AM Jacobo Evalene PARAS, MD [facility] None  10/26/2023 11:00 AM [facility] INFUSION CHAIR 4 [facility] None    Glenys RAMAN Fredirick, MD

## 2023-09-25 ENCOUNTER — Encounter: Payer: Self-pay | Admitting: Oncology

## 2023-09-26 ENCOUNTER — Encounter: Payer: Self-pay | Admitting: Oncology

## 2023-10-05 ENCOUNTER — Ambulatory Visit (HOSPITAL_BASED_OUTPATIENT_CLINIC_OR_DEPARTMENT_OTHER)

## 2023-10-05 ENCOUNTER — Ambulatory Visit (INDEPENDENT_AMBULATORY_CARE_PROVIDER_SITE_OTHER): Admitting: Family Medicine

## 2023-10-05 ENCOUNTER — Other Ambulatory Visit (HOSPITAL_COMMUNITY)
Admission: RE | Admit: 2023-10-05 | Discharge: 2023-10-05 | Disposition: A | Discharge status: Home / Self Care | Source: Ambulatory Visit | Attending: Family Medicine | Admitting: Family Medicine

## 2023-10-05 ENCOUNTER — Ambulatory Visit (HOSPITAL_BASED_OUTPATIENT_CLINIC_OR_DEPARTMENT_OTHER): Admitting: Maternal & Fetal Medicine

## 2023-10-05 VITALS — BP 109/74 | HR 121 | Wt 173.0 lb

## 2023-10-05 DIAGNOSIS — O0993 Supervision of high risk pregnancy, unspecified, third trimester: Secondary | ICD-10-CM

## 2023-10-05 DIAGNOSIS — O403XX0 Polyhydramnios, third trimester, not applicable or unspecified: Secondary | ICD-10-CM | POA: Diagnosis not present

## 2023-10-05 DIAGNOSIS — D649 Anemia, unspecified: Secondary | ICD-10-CM | POA: Insufficient documentation

## 2023-10-05 DIAGNOSIS — O99013 Anemia complicating pregnancy, third trimester: Secondary | ICD-10-CM | POA: Insufficient documentation

## 2023-10-05 DIAGNOSIS — E669 Obesity, unspecified: Secondary | ICD-10-CM | POA: Diagnosis not present

## 2023-10-05 DIAGNOSIS — O99210 Obesity complicating pregnancy, unspecified trimester: Secondary | ICD-10-CM

## 2023-10-05 DIAGNOSIS — O283 Abnormal ultrasonic finding on antenatal screening of mother: Secondary | ICD-10-CM | POA: Diagnosis not present

## 2023-10-05 DIAGNOSIS — D509 Iron deficiency anemia, unspecified: Secondary | ICD-10-CM

## 2023-10-05 DIAGNOSIS — Z98891 History of uterine scar from previous surgery: Secondary | ICD-10-CM | POA: Diagnosis not present

## 2023-10-05 DIAGNOSIS — O99213 Obesity complicating pregnancy, third trimester: Secondary | ICD-10-CM | POA: Insufficient documentation

## 2023-10-05 DIAGNOSIS — O358XX0 Maternal care for other (suspected) fetal abnormality and damage, not applicable or unspecified: Secondary | ICD-10-CM | POA: Insufficient documentation

## 2023-10-05 DIAGNOSIS — Z3A36 36 weeks gestation of pregnancy: Secondary | ICD-10-CM | POA: Insufficient documentation

## 2023-10-05 DIAGNOSIS — Z362 Encounter for other antenatal screening follow-up: Secondary | ICD-10-CM | POA: Insufficient documentation

## 2023-10-05 DIAGNOSIS — O34219 Maternal care for unspecified type scar from previous cesarean delivery: Secondary | ICD-10-CM

## 2023-10-05 NOTE — Progress Notes (Signed)
 After review, MFM consult with provider is not indicated for today  William Glenn, DO 10/05/2023 2:14 PM  [facility]

## 2023-10-05 NOTE — Progress Notes (Signed)
   PRENATAL VISIT NOTE  Subjective:  Linda Kidd is a 28 y.o. G2P1001 at [redacted]w[redacted]d being seen today for ongoing prenatal care.  She is currently monitored for the following issues for this low-risk pregnancy and has Axillary hidradenitis suppurativa; Maternal iron  deficiency anemia complicating pregnancy in third trimester; History of cesarean delivery; Wrist pain; Supervision of high-risk pregnancy; Obesity in pregnancy; Echogenic intracardiac focus of fetus on prenatal ultrasound; and Polyhydramnios affecting pregnancy in third trimester on their problem list.  Patient reports no complaints.  Contractions: Irregular. Vag. Bleeding: None.  Movement: Present. Denies leaking of fluid.   The following portions of the patient's history were reviewed and updated as appropriate: allergies, current medications, past family history, past medical history, past social history, past surgical history and problem list.   Objective:    Vitals:   10/05/23 0947  BP: 109/74  Pulse: (!) 121  Weight: 173 lb (78.5 kg)    Fetal Status:  Fetal Heart Rate (bpm): 143   Movement: Present    General: Alert, oriented and cooperative. Patient is in no acute distress.  Skin: Skin is warm and dry. No rash noted.   Cardiovascular: Normal heart rate noted  Respiratory: Normal respiratory effort, no problems with respiration noted  Abdomen: Soft, gravid, appropriate for gestational age.  Pain/Pressure: Present     Pelvic: Cervical exam performed in the presence of a chaperone        Extremities: Normal range of motion.  Edema: None  Mental Status: Normal mood and affect. Normal behavior. Normal judgment and thought content.   Assessment and Plan:  Pregnancy: G2P1001 at [redacted]w[redacted]d 1. Supervision of high risk pregnancy in third trimester (Primary) Cultures today - Cervicovaginal ancillary only - Strep Gp B NAA  2. Polyhydramnios affecting pregnancy in third trimester For f/u u/s today Normal 2 hour  If persists,  may need IOL  3. History of cesarean delivery For TOLAC, consent signed  4. [redacted] weeks gestation of pregnancy Continue prenatal care.  5. Maternal iron  deficiency anemia complicating pregnancy in third trimester Normal growth 36% last month  Preterm labor symptoms and general obstetric precautions including but not limited to vaginal bleeding, contractions, leaking of fluid and fetal movement were reviewed in detail with the patient. Please refer to After Visit Summary for other counseling recommendations.   Return in 1 week (on 10/12/2023).  Future Appointments  Date Time Provider Department Center  10/05/2023  2:00 PM [facility] PROVIDER 1 [facility]  10/05/2023  2:15 PM [facility] US2 [facility]  10/12/2023 11:15 AM Constant, Peggy, MD [facility]  10/19/2023  8:55 AM Anyanwu, Gloris LABOR, MD [facility]  10/25/2023 10:00 AM [facility] None  10/26/2023 10:30 AM Jacobo Evalene PARAS, MD [facility] None  10/26/2023 11:00 AM [facility] INFUSION CHAIR 4 [facility] None  10/26/2023  1:30 PM Anyanwu, Gloris LABOR, MD [facility]    Glenys GORMAN Birk, MD

## 2023-10-05 NOTE — Progress Notes (Signed)
 ROB: GBS today, wants cervix checked

## 2023-10-06 LAB — CERVICOVAGINAL ANCILLARY ONLY
Chlamydia: NEGATIVE
Comment: NEGATIVE
Comment: NORMAL
Neisseria Gonorrhea: NEGATIVE

## 2023-10-07 ENCOUNTER — Ambulatory Visit: Payer: Self-pay | Admitting: Family Medicine

## 2023-10-07 DIAGNOSIS — O0993 Supervision of high risk pregnancy, unspecified, third trimester: Secondary | ICD-10-CM

## 2023-10-07 LAB — STREP GP B NAA: Strep Gp B NAA: NEGATIVE

## 2023-10-12 ENCOUNTER — Ambulatory Visit (INDEPENDENT_AMBULATORY_CARE_PROVIDER_SITE_OTHER): Admitting: Obstetrics and Gynecology

## 2023-10-12 ENCOUNTER — Encounter: Payer: Self-pay | Admitting: Obstetrics and Gynecology

## 2023-10-12 ENCOUNTER — Encounter: Payer: Self-pay | Admitting: *Deleted

## 2023-10-12 VITALS — BP 121/79 | HR 125 | Wt 173.0 lb

## 2023-10-12 DIAGNOSIS — O0993 Supervision of high risk pregnancy, unspecified, third trimester: Secondary | ICD-10-CM

## 2023-10-12 DIAGNOSIS — O99210 Obesity complicating pregnancy, unspecified trimester: Secondary | ICD-10-CM | POA: Diagnosis not present

## 2023-10-12 DIAGNOSIS — Z98891 History of uterine scar from previous surgery: Secondary | ICD-10-CM | POA: Diagnosis not present

## 2023-10-12 DIAGNOSIS — O99013 Anemia complicating pregnancy, third trimester: Secondary | ICD-10-CM | POA: Diagnosis not present

## 2023-10-12 DIAGNOSIS — D509 Iron deficiency anemia, unspecified: Secondary | ICD-10-CM

## 2023-10-12 DIAGNOSIS — O403XX0 Polyhydramnios, third trimester, not applicable or unspecified: Secondary | ICD-10-CM

## 2023-10-12 NOTE — Progress Notes (Signed)
 ROB denies any concerns   Does want cervix checked today as she is having increased pressure and contractions

## 2023-10-12 NOTE — Progress Notes (Signed)
   PRENATAL VISIT NOTE  Subjective:  Linda Kidd is a 28 y.o. G2P1001 at [redacted]w[redacted]d being seen today for ongoing prenatal care.  She is currently monitored for the following issues for this low-risk pregnancy and has Axillary hidradenitis suppurativa; Maternal iron  deficiency anemia complicating pregnancy in third trimester; History of cesarean delivery; Wrist pain; Supervision of high-risk pregnancy; Obesity in pregnancy; Echogenic intracardiac focus of fetus on prenatal ultrasound; and Polyhydramnios affecting pregnancy in third trimester on their problem list.  Patient reports no complaints.  Contractions: Irregular. Vag. Bleeding: None.  Movement: Present. Denies leaking of fluid.   The following portions of the patient's history were reviewed and updated as appropriate: allergies, current medications, past family history, past medical history, past social history, past surgical history and problem list.   Objective:    Vitals:   10/12/23 1119  BP: 121/79  Pulse: (!) 125  Weight: 173 lb (78.5 kg)    Fetal Status:  Fetal Heart Rate (bpm): 150 Fundal Height: 38 cm Movement: Present    General: Alert, oriented and cooperative. Patient is in no acute distress.  Skin: Skin is warm and dry. No rash noted.   Cardiovascular: Normal heart rate noted  Respiratory: Normal respiratory effort, no problems with respiration noted  Abdomen: Soft, gravid, appropriate for gestational age.  Pain/Pressure: Present     Pelvic: Cervical exam performed in the presence of a chaperone Dilation: Closed Effacement (%): Thick Station: Ballotable  Extremities: Normal range of motion.  Edema: Trace  Mental Status: Normal mood and affect. Normal behavior. Normal judgment and thought content.   Assessment and Plan:  Pregnancy: G2P1001 at [redacted]w[redacted]d 1. Supervision of high risk pregnancy in third trimester (Primary) Patient is doing well Planning oral contraceptive pills for now but may change her mind- exploring  options  2. History of cesarean delivery Plans TOLAC  3. Maternal iron  deficiency anemia complicating pregnancy in third trimester Next iron  infusion scheduled on 9/4  4. Obesity in pregnancy Continue ASA  5. Polyhydramnios affecting pregnancy in third trimester Resolved on recent scan 8/14 EFW 2830 gm (42%tile)  Preterm labor symptoms and general obstetric precautions including but not limited to vaginal bleeding, contractions, leaking of fluid and fetal movement were reviewed in detail with the patient. Please refer to After Visit Summary for other counseling recommendations.   Return in about 1 week (around 10/19/2023) for in person, ROB, Low risk.  Future Appointments  Date Time Provider Department Center  10/19/2023  8:55 AM Anyanwu, Gloris LABOR, MD [facility]  10/25/2023 10:00 AM [facility] None  10/26/2023 10:30 AM Jacobo Evalene PARAS, MD [facility] None  10/26/2023 11:00 AM [facility] INFUSION CHAIR 4 [facility] None  10/26/2023  1:30 PM Anyanwu, Gloris LABOR, MD [facility]    Winton Felt, MD

## 2023-10-12 NOTE — Patient Instructions (Signed)

## 2023-10-19 ENCOUNTER — Encounter: Payer: Self-pay | Admitting: Obstetrics & Gynecology

## 2023-10-19 ENCOUNTER — Ambulatory Visit (INDEPENDENT_AMBULATORY_CARE_PROVIDER_SITE_OTHER): Admitting: Obstetrics & Gynecology

## 2023-10-19 VITALS — BP 111/76 | HR 114 | Wt 174.4 lb

## 2023-10-19 DIAGNOSIS — O0993 Supervision of high risk pregnancy, unspecified, third trimester: Secondary | ICD-10-CM

## 2023-10-19 DIAGNOSIS — Z3A38 38 weeks gestation of pregnancy: Secondary | ICD-10-CM | POA: Diagnosis not present

## 2023-10-19 DIAGNOSIS — D509 Iron deficiency anemia, unspecified: Secondary | ICD-10-CM

## 2023-10-19 DIAGNOSIS — O99013 Anemia complicating pregnancy, third trimester: Secondary | ICD-10-CM | POA: Diagnosis not present

## 2023-10-19 DIAGNOSIS — Z98891 History of uterine scar from previous surgery: Secondary | ICD-10-CM | POA: Diagnosis not present

## 2023-10-19 NOTE — Progress Notes (Signed)
   PRENATAL VISIT NOTE  Subjective:  Linda Kidd is a 28 y.o. G2P1001 at [redacted]w[redacted]d being seen today for ongoing prenatal care.  She is currently monitored for the following issues for this high-risk pregnancy and has Axillary hidradenitis suppurativa; Maternal iron  deficiency anemia complicating pregnancy in third trimester; History of cesarean delivery; Wrist pain; Supervision of high-risk pregnancy; Obesity in pregnancy; and Echogenic intracardiac focus of fetus on prenatal ultrasound on their problem list.  Patient reports no complaints.  Contractions: Not present.  .  Movement: Present. Denies leaking of fluid.   The following portions of the patient's history were reviewed and updated as appropriate: allergies, current medications, past family history, past medical history, past social history, past surgical history and problem list.   Objective:    Vitals:   10/19/23 0858  BP: 111/76  Pulse: (!) 114  Weight: 174 lb 6 oz (79.1 kg)    Fetal Status:  Fetal Heart Rate (bpm): 141 Fundal Height: 40 cm Movement: Present    General: Alert, oriented and cooperative. Patient is in no acute distress.  Skin: Skin is warm and dry. No rash noted.   Cardiovascular: Normal heart rate noted  Respiratory: Normal respiratory effort, no problems with respiration noted  Abdomen: Soft, gravid, appropriate for gestational age.  Pain/Pressure: Present     Pelvic: Cervical exam deferred        Extremities: Normal range of motion.  Edema: Trace  Mental Status: Normal mood and affect. Normal behavior. Normal judgment and thought content.   Assessment and Plan:  Pregnancy: G2P1001 at [redacted]w[redacted]d 1. Maternal iron  deficiency anemia complicating pregnancy in third trimester (Primary) Getting Venofer  on 9/4, will continue to monitor  2. History of cesarean delivery Interested in TOLAC if spontaneous labor occurs, consented on 09/07/23. If no spontaneous labor, she may decide on getting RCS in lieu of IOL, she  will let us  know by next visit.  3. [redacted] weeks gestation of pregnancy 4. Supervision of high risk pregnancy in third trimester No other concerns. Term labor symptoms and general obstetric precautions including but not limited to vaginal bleeding, contractions, leaking of fluid and fetal movement were reviewed in detail with the patient. Please refer to After Visit Summary for other counseling recommendations.   Return in about 1 week (around 10/26/2023) for OFFICE OB VISIT (MD or APP).  Future Appointments  Date Time Provider Department Center  10/25/2023 10:00 AM [facility] None  10/26/2023 10:30 AM Jacobo Evalene PARAS, MD [facility] None  10/26/2023 11:00 AM [facility] INFUSION CHAIR 4 [facility] None  10/26/2023  1:30 PM Orine Goga, Gloris LABOR, MD [facility]    Gloris Hugger, MD

## 2023-10-19 NOTE — Patient Instructions (Signed)

## 2023-10-24 ENCOUNTER — Other Ambulatory Visit: Payer: Self-pay | Admitting: *Deleted

## 2023-10-24 DIAGNOSIS — O99013 Anemia complicating pregnancy, third trimester: Secondary | ICD-10-CM

## 2023-10-25 ENCOUNTER — Inpatient Hospital Stay

## 2023-10-25 ENCOUNTER — Inpatient Hospital Stay: Attending: Oncology

## 2023-10-25 DIAGNOSIS — Z833 Family history of diabetes mellitus: Secondary | ICD-10-CM | POA: Diagnosis not present

## 2023-10-25 DIAGNOSIS — Z7982 Long term (current) use of aspirin: Secondary | ICD-10-CM | POA: Insufficient documentation

## 2023-10-25 DIAGNOSIS — Z8049 Family history of malignant neoplasm of other genital organs: Secondary | ICD-10-CM | POA: Insufficient documentation

## 2023-10-25 DIAGNOSIS — D509 Iron deficiency anemia, unspecified: Secondary | ICD-10-CM | POA: Diagnosis present

## 2023-10-25 DIAGNOSIS — O2603 Excessive weight gain in pregnancy, third trimester: Secondary | ICD-10-CM | POA: Diagnosis not present

## 2023-10-25 DIAGNOSIS — O99013 Anemia complicating pregnancy, third trimester: Secondary | ICD-10-CM | POA: Insufficient documentation

## 2023-10-25 DIAGNOSIS — Z3A39 39 weeks gestation of pregnancy: Secondary | ICD-10-CM | POA: Insufficient documentation

## 2023-10-25 DIAGNOSIS — Z79899 Other long term (current) drug therapy: Secondary | ICD-10-CM | POA: Diagnosis not present

## 2023-10-25 DIAGNOSIS — Z6837 Body mass index (BMI) 37.0-37.9, adult: Secondary | ICD-10-CM | POA: Diagnosis not present

## 2023-10-25 LAB — CBC WITH DIFFERENTIAL/PLATELET
Abs Immature Granulocytes: 0.07 K/uL (ref 0.00–0.07)
Basophils Absolute: 0 K/uL (ref 0.0–0.1)
Basophils Relative: 0 %
Eosinophils Absolute: 0.1 K/uL (ref 0.0–0.5)
Eosinophils Relative: 1 %
HCT: 32.6 % — ABNORMAL LOW (ref 36.0–46.0)
Hemoglobin: 10.8 g/dL — ABNORMAL LOW (ref 12.0–15.0)
Immature Granulocytes: 1 %
Lymphocytes Relative: 21 %
Lymphs Abs: 1.9 K/uL (ref 0.7–4.0)
MCH: 29.8 pg (ref 26.0–34.0)
MCHC: 33.1 g/dL (ref 30.0–36.0)
MCV: 89.8 fL (ref 80.0–100.0)
Monocytes Absolute: 0.6 K/uL (ref 0.1–1.0)
Monocytes Relative: 7 %
Neutro Abs: 6.3 K/uL (ref 1.7–7.7)
Neutrophils Relative %: 70 %
Platelets: 137 K/uL — ABNORMAL LOW (ref 150–400)
RBC: 3.63 MIL/uL — ABNORMAL LOW (ref 3.87–5.11)
RDW: 17.6 % — ABNORMAL HIGH (ref 11.5–15.5)
WBC: 9 K/uL (ref 4.0–10.5)
nRBC: 0 % (ref 0.0–0.2)

## 2023-10-25 LAB — FERRITIN: Ferritin: 23 ng/mL (ref 11–307)

## 2023-10-25 LAB — IRON AND TIBC
Iron: 75 ug/dL (ref 28–170)
Saturation Ratios: 14 % (ref 10.4–31.8)
TIBC: 526 ug/dL — ABNORMAL HIGH (ref 250–450)
UIBC: 451 ug/dL

## 2023-10-26 ENCOUNTER — Ambulatory Visit (INDEPENDENT_AMBULATORY_CARE_PROVIDER_SITE_OTHER): Admitting: Obstetrics & Gynecology

## 2023-10-26 ENCOUNTER — Encounter: Payer: Self-pay | Admitting: Oncology

## 2023-10-26 ENCOUNTER — Encounter: Payer: Self-pay | Admitting: Obstetrics & Gynecology

## 2023-10-26 ENCOUNTER — Inpatient Hospital Stay

## 2023-10-26 ENCOUNTER — Inpatient Hospital Stay (HOSPITAL_BASED_OUTPATIENT_CLINIC_OR_DEPARTMENT_OTHER): Admitting: Oncology

## 2023-10-26 VITALS — BP 124/84 | HR 118 | Wt 177.0 lb

## 2023-10-26 VITALS — BP 129/82 | HR 122 | Temp 98.3°F | Resp 20 | Wt 174.4 lb

## 2023-10-26 DIAGNOSIS — O99013 Anemia complicating pregnancy, third trimester: Secondary | ICD-10-CM | POA: Diagnosis not present

## 2023-10-26 DIAGNOSIS — Z98891 History of uterine scar from previous surgery: Secondary | ICD-10-CM | POA: Diagnosis not present

## 2023-10-26 DIAGNOSIS — Z3A39 39 weeks gestation of pregnancy: Secondary | ICD-10-CM

## 2023-10-26 DIAGNOSIS — O0993 Supervision of high risk pregnancy, unspecified, third trimester: Secondary | ICD-10-CM | POA: Diagnosis not present

## 2023-10-26 DIAGNOSIS — Z3A00 Weeks of gestation of pregnancy not specified: Secondary | ICD-10-CM | POA: Diagnosis not present

## 2023-10-26 NOTE — Progress Notes (Signed)
 ROB: presentation ?  Induction? Has questions about C-section.

## 2023-10-26 NOTE — Progress Notes (Signed)
   PRENATAL VISIT NOTE  Subjective:  Linda Kidd is a 28 y.o. G2P1001 at [redacted]w[redacted]d being seen today for ongoing prenatal care.  She is currently monitored for the following issues for this high-risk pregnancy and has Axillary hidradenitis suppurativa; Maternal iron  deficiency anemia complicating pregnancy in third trimester; History of cesarean delivery; Wrist pain; Supervision of high-risk pregnancy; Obesity in pregnancy; and Echogenic intracardiac focus of fetus on prenatal ultrasound on their problem list.  Patient reports no complaints.  Contractions: Irregular. Vag. Bleeding: None.  Movement: Present. Denies leaking of fluid.   The following portions of the patient's history were reviewed and updated as appropriate: allergies, current medications, past family history, past medical history, past social history, past surgical history and problem list.   Objective:    Vitals:   10/26/23 1334  BP: 124/84  Pulse: (!) 118  Weight: 177 lb (80.3 kg)    Fetal Status:  Fetal Heart Rate (bpm): 145 Fundal Height: 40 cm Movement: Present Presentation: Vertex (Checked on bedside ultrasound)  General: Alert, oriented and cooperative. Patient is in no acute distress.  Skin: Skin is warm and dry. No rash noted.   Cardiovascular: Normal heart rate noted  Respiratory: Normal respiratory effort, no problems with respiration noted  Abdomen: Soft, gravid, appropriate for gestational age.  Pain/Pressure: Present     Pelvic: Cervical exam deferred        Extremities: Normal range of motion.  Edema: Moderate pitting, indentation subsides rapidly  Mental Status: Normal mood and affect. Normal behavior. Normal judgment and thought content.   Assessment and Plan:  Pregnancy: G2P1001 at [redacted]w[redacted]d 1. History of cesarean delivery 2. [redacted] weeks gestation of pregnancy 3. Supervision of high risk pregnancy in third trimester (Primary) Postdates testing next week, this will be scheduled. IOL scheduled for 11/06/23  at midnight, orders signed and held. Labor symptoms and general obstetric precautions including but not limited to vaginal bleeding, contractions, leaking of fluid and fetal movement were reviewed in detail with the patient. Please refer to After Visit Summary for other counseling recommendations.   Return in about 1 week (around 11/02/2023) for OFFICE OB VISIT (MD only), BPP.  Future Appointments  Date Time Provider Department Center  11/02/2023 10:35 AM Shykeem Resurreccion, Gloris LABOR, MD [facility]  11/06/2023 12:00 AM [facility] SCHED ROOM [facility] None  03/05/2024  8:00 AM [facility] None  03/06/2024 10:15 AM Jacobo, Evalene PARAS, MD [facility] None  03/06/2024 10:30 AM [facility] INFUSION CHAIR 4 [facility] None    Gloris Hugger, MD

## 2023-10-26 NOTE — Patient Instructions (Signed)
  Things to Try After 37 weeks for Cervical Ripening (to get your cervix ready for labor) : May try one or all:     Try the [employer] at [email or web address] daily to improve baby's position and encourage the onset of labor.  Cervical Ripening: May try one or both Red Raspberry Leaf capsules or tea:  two 300mg  or 400mg  tablets with each meal, 2-3 times a day, or 1-3 cups of tea daily  Potential Side Effects Of Raspberry Leaf:  Most women do not experience any side effects from drinking raspberry leaf tea. However, nausea and loose stools are possible   Evening Primrose Oil capsules: take 1 capsule by mouth and place one capsule in the vagina every night.    Some of the potential side effects:  Upset stomach  Loose stools or diarrhea  Headaches  Nausea    3.  6 Dates a day (may taste better if warmed in microwave until soft). Found where raisins are in the grocery store

## 2023-10-26 NOTE — Progress Notes (Unsigned)
 [facility]  [phone] [phone]  ID: Linda Kidd OB: 12/30/1995  MR#: [id number]  RDW#:[id number]  Patient Care Team: Patient, No Pcp Per as PCP - General (General Practice) Linda Evalene PARAS, MD as Consulting Physician (Oncology)  CHIEF COMPLAINT: Iron  deficiency in third trimester pregnancy.  INTERVAL HISTORY: Patient returns to clinic today for repeat laboratory work, further evaluation, and consideration of additional IV Venofer .  Her due date is Monday.  She currently feels well and is asymptomatic.  She does not complain of any weakness or fatigue.  She is gaining weight appropriately.  She has no neurologic complaints.  She denies any recent fevers or illnesses.  She has no chest pain, shortness of breath, cough, or hemoptysis.  She denies any nausea, vomiting, constipation, or diarrhea.  She has no melena or hematochezia.  She has no urinary complaints.  Patient offers no further specific complaints today.  REVIEW OF SYSTEMS:   Review of Systems  Constitutional: Negative.  Negative for fever, malaise/fatigue and weight loss.  Respiratory: Negative.  Negative for cough, hemoptysis and shortness of breath.   Cardiovascular: Negative.  Negative for chest pain and leg swelling.  Gastrointestinal: Negative.  Negative for abdominal pain, blood in stool and melena.  Genitourinary: Negative.  Negative for dysuria.  Musculoskeletal: Negative.  Negative for myalgias.  Skin: Negative.  Negative for rash.  Neurological: Negative.  Negative for dizziness, focal weakness, weakness and headaches.  Psychiatric/Behavioral: Negative.  The patient is not nervous/anxious.     As per HPI. Otherwise, a complete review of systems is negative.  PAST MEDICAL HISTORY: Past Medical History:  Diagnosis Date   Medical history non-contributory     PAST SURGICAL HISTORY: Past Surgical History:  Procedure Laterality Date   CESAREAN SECTION N/A 04/17/2022    Procedure: CESAREAN SECTION;  Surgeon: Linda Vonn DEL, MD;  Location: [facility] LD ORS;  Service: Obstetrics;  Laterality: N/A;   NO PAST SURGERIES      FAMILY HISTORY: Family History  Problem Relation Age of Onset   Cervical cancer Linda Kidd    Diabetes Father     ADVANCED DIRECTIVES (Y/N):  N  HEALTH MAINTENANCE: Social History   Tobacco Use   Smoking status: Never   Smokeless tobacco: Never  Vaping Use   Vaping status: Never Used  Substance Use Topics   Alcohol use: Not Currently    Alcohol/week: 1.0 standard drink of alcohol    Types: 1 Glasses of wine per week    Comment: a drink a week on Fridays   Drug use: Never     Colonoscopy:  PAP:  Bone density:  Lipid panel:  Allergies  Allergen Reactions   Excedrin Back & [Acetaminophen -Aspirin  Buffered] Other (See Comments)    Gi burning    Current Outpatient Medications  Medication Sig Dispense Refill   Prenatal Vit-Fe Fumarate-FA (PRENATAL VITAMINS PO) Take by mouth.     aspirin  EC 81 MG tablet Take 1 tablet (81 mg total) by mouth daily. (Patient not taking: Reported on 10/26/2023) 60 tablet 1   No current facility-administered medications for this visit.    OBJECTIVE: Vitals:   10/26/23 1033  BP: 129/82  Pulse: (!) 122  Resp: 20  Temp: 98.3 F (36.8 C)  SpO2: 100%     Body mass index is 37.74 kg/m.    ECOG FS:0 - Asymptomatic  General: Well-developed, well-nourished, no acute distress. Eyes: Pink conjunctiva, anicteric sclera. HEENT: Normocephalic, moist mucous membranes. Lungs: No audible wheezing or coughing.  Heart: Regular rate and rhythm. Abdomen: Soft, nontender, no obvious distention. Musculoskeletal: No edema, cyanosis, or clubbing. Neuro: Alert, answering all questions appropriately. Cranial nerves grossly intact. Skin: No rashes or petechiae noted. Psych: Normal affect.  LAB RESULTS:  Lab Results  Component Value Date   NA 137 03/12/2021   K 3.7 03/12/2021   CL 104 03/12/2021   CO2 28  03/12/2021   GLUCOSE 96 03/12/2021   BUN 13 03/12/2021   CREATININE 0.72 03/12/2021   CALCIUM  9.2 03/12/2021   PROT 7.9 03/12/2021   ALBUMIN 4.5 03/12/2021   AST 19 03/12/2021   ALT 9 03/12/2021   ALKPHOS 50 03/12/2021   BILITOT 0.5 03/12/2021   GFRNONAA >60 09/27/2015   GFRAA 144 03/12/2019    Lab Results  Component Value Date   WBC 9.0 10/25/2023   NEUTROABS 6.3 10/25/2023   HGB 10.8 (L) 10/25/2023   HCT 32.6 (L) 10/25/2023   MCV 89.8 10/25/2023   PLT 137 (L) 10/25/2023   Lab Results  Component Value Date   FERRITIN 23 10/25/2023   Lab Results  Component Value Date   IRON  75 10/25/2023   TIBC 526 (H) 10/25/2023   IRONPCTSAT 14 10/25/2023     STUDIES: US  MFM OB FOLLOW UP Result Date: 10/05/2023 ----------------------------------------------------------------------  OBSTETRICS REPORT                       (Signed Final 10/05/2023 02:15 pm) ---------------------------------------------------------------------- Patient Info  ID #:       [id number]                          D.O.B.:  01-06-1996 (28 yrs)(F)  Name:       Linda Kidd                Visit Date: 10/05/2023 12:29 pm ---------------------------------------------------------------------- Performed By  Attending:        Delora Smaller DO       Ref. Address:     [address]                                                             [address]  Performed By:     Linda Kidd        [facility]                                                             [facility]  Referred By:      [facility] Linda Kidd ---------------------------------------------------------------------- Orders  #  Description                           Code        Ordered By  1  US  MFM OB FOLLOW UP                   [id number]    Linda Kidd  ----------------------------------------------------------------------  #  Order #                     Accession #                Episode #  1  [id number]                   [id number]                 [id number] ---------------------------------------------------------------------- Indications  Polyhydramnios, third trimester, antepartum    O40.3XX0  condition or complication, unspecified fetus  History of cesarean delivery, currently        O34.219  pregnant  Obesity complicating pregnancy, third          O99.213  trimester (BMI 33)  Anemia during pregnancy in third trimester     O99.013  Echogenic intracardiac focus of the heart      O35.8XX0  (EIF)  Low lying placenta, antepartum (RESOLVED)      O44.40  Encounter for other antenatal screening        Z36.2  follow-up  LR NIPS - Female, 2hr GTT WNL  [redacted] weeks gestation of pregnancy                Z3A.36 ---------------------------------------------------------------------- Fetal Evaluation  Num Of Fetuses:         1  Fetal Heart Rate(bpm):  131  Cardiac Activity:       Observed  Presentation:           Cephalic  Placenta:               Anterior  P. Cord Insertion:      Previously seen  Amniotic Fluid  AFI FV:      Within normal limits  AFI Sum(cm)     %Tile       Largest Pocket(cm)  18.18           68          7.31  RUQ(cm)       RLQ(cm)       LUQ(cm)        LLQ(cm)  3.26          4.07          7.31           3.54 ---------------------------------------------------------------------- Biophysical Evaluation  Amniotic F.V:   Pocket => 2 cm             F. Tone:        Observed  F. Movement:    Observed                   Score:          8/8  F. Breathing:   Observed ---------------------------------------------------------------------- Biometry  BPD:      93.6  mm     G. Age:  38w 1d         93  %  CI:        78.56   %    70 - 86                                                          FL/HC:      19.7   %    20.1 - 22.1  HC:       334   mm     G. Age:  38w 1d         64   %    HC/AC:      1.03        0.93 - 1.11  AC:      323.2  mm     G. Age:  36w 2d         56  %    FL/BPD:     70.2   %    71 - 87  FL:       65.7  mm     G. Age:  33w 6d          3  %    FL/AC:      20.3   %    20 - 24  Est. FW:    2830  gm      6 lb 4 oz     42  % ---------------------------------------------------------------------- OB History  Gravidity:    2         Term:   1  Living:       1 ---------------------------------------------------------------------- Gestational Age  LMP:           36w 3d        Date:  01/23/23                  EDD:   10/30/23  U/S Today:     36w 4d                                        EDD:   10/29/23  Best:          36w 3d     Det. By:  LMP  (01/23/23)          EDD:   10/30/23 ---------------------------------------------------------------------- Anatomy  Cranium:               Appears normal         Aortic Arch:            Previously seen  Cavum:                 Previously seen        Ductal Arch:            Previously seen  Ventricles:            Appears normal         Diaphragm:              Appears normal  Choroid Plexus:        Previously seen        Stomach:                Appears normal, left  sided  Cerebellum:            Appears normal         Abdomen:                Previously seen  Posterior Fossa:       Appears normal         Abdominal Wall:         Previously seen  Face:                  Orbits and profile     Cord Vessels:           Previously seen                         previously seen  Lips:                  Previously seen        Kidneys:                Appear normal  Thoracic:              Previously seen        Bladder:                Appears normal  Heart:                 Appears normal         Spine:                  Previously seen                         (4CH, axis, and                         situs)  RVOT:                  Previously seen        Upper Extremities:      Previously seen  LVOT:                   Previously seen        Lower Extremities:      Previously seen  Other:  Female gender previously seen. Fetal anatomic survey complete on          prior scans. ---------------------------------------------------------------------- Comments  Sonographic findings  Single intrauterine pregnancy at 36w 3d.  Fetal cardiac activity:  Observed and appears normal.  Presentation: Cephalic.  Interval fetal anatomy appears normal.  Fetal biometry shows the estimated fetal weight at the 42  percentile.  Amniotic fluid volume: Within normal limits. MVP: 7.31 cm.  Placenta: Anterior.  There are limitations of prenatal ultrasound such as the  inability to detect certain abnormalities due to poor  visualization. Various factors such as fetal position,  gestational age and maternal body habitus may increase the  difficulty in visualizing the fetal anatomy.  Recommendations  -No further ultrasounds are recommended at this time based  on the current indications. If future indications arise (e.g.  size/date discrepancy on fundal height, gestational diabetes  or hypertension) and an ultrasound is to be desired at our  MFM office, please send a referral. ----------------------------------------------------------------------                  Linda Smaller, DO Electronically Signed Final Report  10/05/2023 02:15 pm ----------------------------------------------------------------------    ASSESSMENT: Iron  deficiency in third trimester pregnancy.  PLAN:    Iron  deficiency in third trimester pregnancy: Patient remains slightly anemic, but her hemoglobin has improved to 10.8.  Iron  stores are now within normal limit she does not require additional IV Venofer  today.  Patient last received treatment on September 21, 2023.  No intervention is needed.  Return to clinic in 4 months for repeat laboratory work, further evaluation, and consideration of additional treatment if necessary.  If patient's laboratory work remains within normal  limits at that time, she likely can be discharged from clinic.   Pregnancy: Patient reports  she is having a boy and due date is approximately October 30, 2023.    I spent a total of 20 minutes reviewing chart data, face-to-face evaluation with the patient, counseling and coordination of care as detailed above.    Patient expressed understanding and was in agreement with this plan. She also understands that She can call clinic at any time with any questions, concerns, or complaints.     [name], MD   10/26/2023 11:11 AM

## 2023-10-26 NOTE — Progress Notes (Signed)
No Venofer today per MD 

## 2023-10-27 ENCOUNTER — Encounter: Payer: Self-pay | Admitting: Oncology

## 2023-11-01 ENCOUNTER — Telehealth (HOSPITAL_COMMUNITY): Payer: Self-pay | Admitting: *Deleted

## 2023-11-01 NOTE — Telephone Encounter (Signed)
 Preadmission screen

## 2023-11-02 ENCOUNTER — Ambulatory Visit (INDEPENDENT_AMBULATORY_CARE_PROVIDER_SITE_OTHER): Admitting: *Deleted

## 2023-11-02 ENCOUNTER — Ambulatory Visit: Admitting: Obstetrics & Gynecology

## 2023-11-02 ENCOUNTER — Other Ambulatory Visit (INDEPENDENT_AMBULATORY_CARE_PROVIDER_SITE_OTHER): Payer: Self-pay

## 2023-11-02 ENCOUNTER — Encounter: Admitting: Obstetrics & Gynecology

## 2023-11-02 VITALS — BP 109/74 | HR 108 | Wt 176.0 lb

## 2023-11-02 DIAGNOSIS — O0993 Supervision of high risk pregnancy, unspecified, third trimester: Secondary | ICD-10-CM

## 2023-11-02 DIAGNOSIS — O480 Post-term pregnancy: Secondary | ICD-10-CM

## 2023-11-02 DIAGNOSIS — Z3A40 40 weeks gestation of pregnancy: Secondary | ICD-10-CM | POA: Diagnosis not present

## 2023-11-02 DIAGNOSIS — Z98891 History of uterine scar from previous surgery: Secondary | ICD-10-CM

## 2023-11-02 NOTE — Progress Notes (Signed)

## 2023-11-02 NOTE — Progress Notes (Signed)
   PRENATAL VISIT NOTE  Subjective:  Linda Kidd is a 28 y.o. G2P1001 at [redacted]w[redacted]d being seen today for ongoing prenatal care.  She is currently monitored for the following issues for this high-risk pregnancy and has Axillary hidradenitis suppurativa; Maternal iron  deficiency anemia complicating pregnancy in third trimester; History of cesarean delivery; Wrist pain; Supervision of high-risk pregnancy; Obesity in pregnancy; and Echogenic intracardiac focus of fetus on prenatal ultrasound on their problem list.  Patient reports no complaints.  Contractions: Irregular. Vag. Bleeding: None.  Movement: Present. Denies leaking of fluid.   The following portions of the patient's history were reviewed and updated as appropriate: allergies, current medications, past family history, past medical history, past social history, past surgical history and problem list.   Objective:    Vitals:   11/02/23 1450  BP: 109/74  Pulse: (!) 108  Weight: 176 lb (79.8 kg)    Fetal Status:  Fetal Heart Rate (bpm): NST   Movement: Present    General: Alert, oriented and cooperative. Patient is in no acute distress.  Skin: Skin is warm and dry. No rash noted.   Cardiovascular: Normal heart rate noted  Respiratory: Normal respiratory effort, no problems with respiration noted  Abdomen: Soft, gravid, appropriate for gestational age.  Pain/Pressure: Present     Pelvic: Cervical exam deferred        Extremities: Normal range of motion.     Mental Status: Normal mood and affect. Normal behavior. Normal judgment and thought content.   Assessment and Plan:  Pregnancy: G2P1001 at [redacted]w[redacted]d 1. History of cesarean delivery 2. Post term pregnancy over 40 weeks (Primary) 3. Supervision of high risk pregnancy in third trimester Desires TOLAC, IOL 11/06/23 midnight. Discussed induction details NST performed today was reviewed and was found to be reactive. Subsequent BPP performed today was also reviewed and was found to be  10/10. AFI was also normal.  Term labor symptoms and general obstetric precautions including but not limited to vaginal bleeding, contractions, leaking of fluid and fetal movement were reviewed in detail with the patient. Please refer to After Visit Summary for other counseling recommendations.   Return for Postpartum check.  Future Appointments  Date Time Provider Department Center  11/06/2023 12:00 AM [facility] SCHED ROOM [facility] None  03/05/2024  8:00 AM [facility] LAB [facility] None  03/06/2024 10:15 AM Jacobo, Evalene PARAS, MD [facility] None  03/06/2024 10:30 AM [facility] INFUSION CHAIR 4 [facility] None    Gloris Hugger, MD

## 2023-11-06 ENCOUNTER — Other Ambulatory Visit: Payer: Self-pay

## 2023-11-06 ENCOUNTER — Inpatient Hospital Stay (HOSPITAL_COMMUNITY): Admitting: Anesthesiology

## 2023-11-06 ENCOUNTER — Inpatient Hospital Stay (HOSPITAL_COMMUNITY)
Admission: RE | Admit: 2023-11-06 | Discharge: 2023-11-09 | DRG: 786 | Disposition: A | Discharge status: Home / Self Care | Attending: Family Medicine | Admitting: Family Medicine

## 2023-11-06 ENCOUNTER — Inpatient Hospital Stay (HOSPITAL_COMMUNITY)

## 2023-11-06 ENCOUNTER — Encounter (HOSPITAL_COMMUNITY): Payer: Self-pay | Admitting: Obstetrics & Gynecology

## 2023-11-06 DIAGNOSIS — O9902 Anemia complicating childbirth: Secondary | ICD-10-CM | POA: Diagnosis present

## 2023-11-06 DIAGNOSIS — Z3A41 41 weeks gestation of pregnancy: Secondary | ICD-10-CM | POA: Diagnosis not present

## 2023-11-06 DIAGNOSIS — Z833 Family history of diabetes mellitus: Secondary | ICD-10-CM | POA: Diagnosis not present

## 2023-11-06 DIAGNOSIS — O411230 Chorioamnionitis, third trimester, not applicable or unspecified: Secondary | ICD-10-CM | POA: Diagnosis present

## 2023-11-06 DIAGNOSIS — O99214 Obesity complicating childbirth: Secondary | ICD-10-CM | POA: Diagnosis present

## 2023-11-06 DIAGNOSIS — O480 Post-term pregnancy: Secondary | ICD-10-CM | POA: Diagnosis present

## 2023-11-06 DIAGNOSIS — O0993 Supervision of high risk pregnancy, unspecified, third trimester: Secondary | ICD-10-CM

## 2023-11-06 DIAGNOSIS — O9972 Diseases of the skin and subcutaneous tissue complicating childbirth: Secondary | ICD-10-CM | POA: Diagnosis present

## 2023-11-06 DIAGNOSIS — O34211 Maternal care for low transverse scar from previous cesarean delivery: Secondary | ICD-10-CM | POA: Diagnosis present

## 2023-11-06 DIAGNOSIS — Z98891 History of uterine scar from previous surgery: Secondary | ICD-10-CM

## 2023-11-06 DIAGNOSIS — L910 Hypertrophic scar: Secondary | ICD-10-CM | POA: Diagnosis present

## 2023-11-06 DIAGNOSIS — O411290 Chorioamnionitis, unspecified trimester, not applicable or unspecified: Secondary | ICD-10-CM | POA: Insufficient documentation

## 2023-11-06 LAB — CBC
HCT: 33.6 % — ABNORMAL LOW (ref 36.0–46.0)
Hemoglobin: 10.9 g/dL — ABNORMAL LOW (ref 12.0–15.0)
MCH: 29.5 pg (ref 26.0–34.0)
MCHC: 32.4 g/dL (ref 30.0–36.0)
MCV: 90.8 fL (ref 80.0–100.0)
Platelets: 141 K/uL — ABNORMAL LOW (ref 150–400)
RBC: 3.7 MIL/uL — ABNORMAL LOW (ref 3.87–5.11)
RDW: 17.5 % — ABNORMAL HIGH (ref 11.5–15.5)
WBC: 9 K/uL (ref 4.0–10.5)
nRBC: 0 % (ref 0.0–0.2)

## 2023-11-06 LAB — TYPE AND SCREEN
ABO/RH(D): A POS
Antibody Screen: NEGATIVE

## 2023-11-06 LAB — RPR: RPR Ser Ql: NONREACTIVE

## 2023-11-06 MED ORDER — LACTATED RINGERS IV SOLN: INTRAVENOUS | Status: AC

## 2023-11-06 MED ORDER — HYDROXYZINE HCL 50 MG PO TABS: 50.0000 mg | ORAL_TABLET | Freq: Four times a day (QID) | ORAL | Status: DC | PRN

## 2023-11-06 MED ORDER — FENTANYL-BUPIVACAINE-NACL 0.5-0.125-0.9 MG/250ML-% EP SOLN
12.0000 mL/h | EPIDURAL | Status: DC | PRN
  Administered 2023-11-06: 10 mL/h via EPIDURAL
  Filled 2023-11-06 (×2): qty 250

## 2023-11-06 MED ORDER — ACETAMINOPHEN 325 MG PO TABS: 650.0000 mg | ORAL_TABLET | ORAL | Status: DC | PRN

## 2023-11-06 MED ORDER — EPHEDRINE 5 MG/ML INJ: 10.0000 mg | INTRAVENOUS | Status: DC | PRN

## 2023-11-06 MED ORDER — PHENYLEPHRINE 80 MCG/ML (10ML) SYRINGE FOR IV PUSH (FOR BLOOD PRESSURE SUPPORT)
80.0000 ug | PREFILLED_SYRINGE | INTRAVENOUS | Status: AC | PRN
  Administered 2023-11-07: 80 ug via INTRAVENOUS
  Administered 2023-11-07: 160 ug via INTRAVENOUS
  Filled 2023-11-06: qty 10

## 2023-11-06 MED ORDER — LACTATED RINGERS IV SOLN: 500.0000 mL | Freq: Once | INTRAVENOUS | Status: DC

## 2023-11-06 MED ORDER — FLEET ENEMA RE ENEM: 1.0000 | ENEMA | Freq: Every day | RECTAL | Status: DC | PRN

## 2023-11-06 MED ORDER — OXYTOCIN BOLUS FROM INFUSION: 333.0000 mL | Freq: Once | INTRAVENOUS | Status: DC

## 2023-11-06 MED ORDER — LIDOCAINE HCL (PF) 1 % IJ SOLN
INTRAMUSCULAR | Status: DC | PRN
  Administered 2023-11-06 (×2): 4 mL via EPIDURAL

## 2023-11-06 MED ORDER — PHENYLEPHRINE 80 MCG/ML (10ML) SYRINGE FOR IV PUSH (FOR BLOOD PRESSURE SUPPORT)
80.0000 ug | PREFILLED_SYRINGE | INTRAVENOUS | Status: DC | PRN
  Administered 2023-11-07: 80 ug via INTRAVENOUS

## 2023-11-06 MED ORDER — TERBUTALINE SULFATE 1 MG/ML IJ SOLN: 0.2500 mg | Freq: Once | INTRAMUSCULAR | Status: DC | PRN

## 2023-11-06 MED ORDER — OXYCODONE-ACETAMINOPHEN 5-325 MG PO TABS: 1.0000 | ORAL_TABLET | ORAL | Status: DC | PRN

## 2023-11-06 MED ORDER — PHENYLEPHRINE 80 MCG/ML (10ML) SYRINGE FOR IV PUSH (FOR BLOOD PRESSURE SUPPORT): 80.0000 ug | PREFILLED_SYRINGE | INTRAVENOUS | Status: DC | PRN

## 2023-11-06 MED ORDER — ZOLPIDEM TARTRATE 5 MG PO TABS: 5.0000 mg | ORAL_TABLET | Freq: Every evening | ORAL | Status: DC | PRN

## 2023-11-06 MED ORDER — OXYTOCIN-SODIUM CHLORIDE 30-0.9 UT/500ML-% IV SOLN: 2.5000 [IU]/h | INTRAVENOUS | Status: DC

## 2023-11-06 MED ORDER — OXYTOCIN-SODIUM CHLORIDE 30-0.9 UT/500ML-% IV SOLN
1.0000 m[IU]/min | INTRAVENOUS | Status: DC
  Administered 2023-11-06: 2 m[IU]/min via INTRAVENOUS
  Filled 2023-11-06: qty 500

## 2023-11-06 MED ORDER — DIPHENHYDRAMINE HCL 50 MG/ML IJ SOLN: 12.5000 mg | INTRAMUSCULAR | Status: DC | PRN

## 2023-11-06 MED ORDER — LACTATED RINGERS IV SOLN
500.0000 mL | Freq: Once | INTRAVENOUS | Status: DC
Start: 2023-11-06 — End: 2023-11-06

## 2023-11-06 MED ORDER — LIDOCAINE HCL (PF) 1 % IJ SOLN: 30.0000 mL | INTRAMUSCULAR | Status: DC | PRN

## 2023-11-06 MED ORDER — LACTATED RINGERS IV SOLN: 500.0000 mL | INTRAVENOUS | Status: AC | PRN

## 2023-11-06 MED ORDER — EPHEDRINE 5 MG/ML INJ
10.0000 mg | INTRAVENOUS | Status: DC | PRN
  Filled 2023-11-06: qty 5

## 2023-11-06 MED ORDER — FENTANYL CITRATE (PF) 100 MCG/2ML IJ SOLN: 50.0000 ug | INTRAMUSCULAR | Status: DC | PRN

## 2023-11-06 MED ORDER — ONDANSETRON HCL 4 MG/2ML IJ SOLN
4.0000 mg | Freq: Four times a day (QID) | INTRAMUSCULAR | Status: DC | PRN
  Administered 2023-11-07: 4 mg via INTRAVENOUS
  Filled 2023-11-06: qty 2

## 2023-11-06 MED ORDER — OXYCODONE-ACETAMINOPHEN 5-325 MG PO TABS: 2.0000 | ORAL_TABLET | ORAL | Status: DC | PRN

## 2023-11-06 MED ORDER — SOD CITRATE-CITRIC ACID 500-334 MG/5ML PO SOLN
30.0000 mL | ORAL | Status: DC | PRN
  Administered 2023-11-07: 30 mL via ORAL
  Filled 2023-11-06: qty 30

## 2023-11-06 NOTE — Anesthesia Procedure Notes (Signed)
 Epidural Patient location during procedure: OB Start time: 11/06/2023 9:53 AM End time: 11/06/2023 9:55 AM  Staffing Anesthesiologist: Lucious Debby BRAVO, MD Performed: anesthesiologist   Preanesthetic Checklist Completed: patient identified, IV checked, risks and benefits discussed, monitors and equipment checked, pre-op evaluation and timeout performed  Epidural Patient position: sitting Prep: DuraPrep Patient monitoring: continuous pulse ox and blood pressure Approach: midline Location: L3-L4 Injection technique: LOR saline  Needle:  Needle type: Tuohy  Needle gauge: 17 G Needle length: 9 cm Needle insertion depth: 5 cm Catheter size: 19 Gauge Catheter at skin depth: 10 cm Test dose: negative and Other (1% lidocaine )  Assessment Events: blood not aspirated and no cerebrospinal fluid  Additional Notes Patient identified. Risks including, but not limited to, bleeding, infection, nerve damage, paralysis, inadequate analgesia, blood pressure changes, nausea, vomiting, allergic reaction, postpartum back pain, itching, and headache were discussed. Patient expressed understanding and wished to proceed. Sterile prep and drape, including hand hygiene, mask, and sterile gloves were used. The patient was positioned and the spine was prepped. The skin was anesthetized with lidocaine . No paraesthesia or other complication noted. The patient did not experience any signs of intravascular injection such as tinnitus or metallic taste in mouth, nor signs of intrathecal spread such as rapid motor block. Please see nursing notes for vital signs. The patient tolerated the procedure well.   Debby Lucious, MDReason for block:procedure for pain

## 2023-11-06 NOTE — Progress Notes (Signed)
 Labor Progress Note Linda Kidd is a 28 y.o. G2P1001 at [redacted]w[redacted]d presenting for post-dates IOL  S: Blocked and comfortable with epidural  O:  BP (!) 104/56   Pulse (!) 105   Temp 98.3 F (36.8 C) (Oral)   Resp 16   Ht 4' 11 (1.499 m)   Wt 78.7 kg   LMP 01/23/2023 (Exact Date)   SpO2 100%   BMI 35.02 kg/m  Lab Results  Component Value Date   HGB 10.9 (L) 11/06/2023    Time: 3:25 PM  FHT: baseline bpm 125, moderate variability, accelerations present, decelerations none, Cat I  Contractions: q 1-6 mins,   1433 CVE: Dilation: 4 Effacement (%): 70 Station: -3 Presentation: Vertex Exam by:: dr trudy AROM with thick meconium   A&P: 28 y.o. G2P1001 [redacted]w[redacted]d IOL for post dates #Labor: Latent Labor Pitocin  and IP Foley AROM #Pain: Epidural #FWB: Category I #GBS negative   [name] trudy, MD 3:25 PM

## 2023-11-06 NOTE — Anesthesia Preprocedure Evaluation (Signed)
 Anesthesia Evaluation  Patient identified by MRN, date of birth, ID band Patient awake    Reviewed: Allergy & Precautions, NPO status , Patient's Chart, lab work & pertinent test results  History of Anesthesia Complications Negative for: history of anesthetic complications  Airway Mallampati: II   Neck ROM: Full    Dental   Pulmonary neg pulmonary ROS   Pulmonary exam normal        Cardiovascular negative cardio ROS Normal cardiovascular exam     Neuro/Psych negative neurological ROS  negative psych ROS   GI/Hepatic negative GI ROS, Neg liver ROS,,,  Endo/Other   Obesity   Renal/GU negative Renal ROS     Musculoskeletal negative musculoskeletal ROS (+)    Abdominal   Peds  Hematology  (+) Blood dyscrasia, anemia  Plt 141k    Anesthesia Other Findings   Reproductive/Obstetrics (+) Pregnancy                              Anesthesia Physical Anesthesia Plan  ASA: 2  Anesthesia Plan: Epidural   Post-op Pain Management:    Induction:   PONV Risk Score and Plan: 2 and Treatment may vary due to age or medical condition  Airway Management Planned: Natural Airway  Additional Equipment: None  Intra-op Plan:   Post-operative Plan:   Informed Consent: I have reviewed the patients History and Physical, chart, labs and discussed the procedure including the risks, benefits and alternatives for the proposed anesthesia with the patient or authorized representative who has indicated his/her understanding and acceptance.       Plan Discussed with: Anesthesiologist  Anesthesia Plan Comments: (Labs reviewed. Platelets acceptable, patient not taking any blood thinning medications. Per RN, FHR tracing reported to be stable enough for sitting procedure. Risks and benefits discussed with patient, including PDPH, backache, epidural hematoma, failed epidural, blood pressure changes, allergic  reaction, and nerve injury. Patient expressed understanding and wished to proceed.)        Anesthesia Quick Evaluation

## 2023-11-06 NOTE — Progress Notes (Signed)
 LABOR NOTE Demetress Tift Millette is a 28 y.o. G2P1001 at [redacted]w[redacted]d admitted for induction of labor due to postdates with hx of C/S  Subjective: no complaints and feeling contractions some, but not uncomfortable  Objective: BP 99/71   Pulse 92   Temp 97.9 F (36.6 C) (Oral)   Resp 16   Ht 4' 11 (1.499 m)   Wt 78.7 kg   LMP 01/23/2023 (Exact Date)   BMI 35.02 kg/m  No intake/output data recorded.  FHR baseline 140 bpm, Variability: moderate, Accelerations:present, Decelerations:  Absent Toco: q 2-3 mins   SVE:   Dilation: 1.5 Effacement (%): 60 Station: -3 Exam by:: Shay, CNM  Pitocin  @ 8 mu/min  Labs: Lab Results  Component Value Date   WBC 9.0 11/06/2023   HGB 10.9 (L) 11/06/2023   HCT 33.6 (L) 11/06/2023   MCV 90.8 11/06/2023   PLT 141 (L) 11/06/2023   CNM to patient bedside to discuss placement for Foley Balloon. Reviewed risks and benefits with patient. Patient desires to proceed with  placement. FHT Cat 1 prior to placement. SVE 1.5/60/-3 S. Emilio CNM. Fetal head well applied to cervix, Foley balloon placement successful on first attempt and filled with 60cc of fluid.  Patient and fetus tolerated well and FHT remained Cat 1 following placement.  Assessment / Plan: IOL d/t postdates, s/p pit & fb   Labor: cervical ripening phase once balloon is expelled, assess for AROM. Holding pit between 8-12 until balloon is expelled.  Fetal Wellbeing:  Category I Pain Control:  labor support without medications I/D:  GBS neg Anticipated MOD: hopeful for vaginal birth  Claris CHRISTELLA Emilio MSN, CNM  11/06/2023, 6:16 AM

## 2023-11-06 NOTE — Progress Notes (Signed)
 Labor Progress Note Linda Kidd is a 28 y.o. G2P1001 at [redacted]w[redacted]d presented for postdates induction  S: Doing well. No concerns at this time.  O:  BP (!) 95/47 (BP Location: Left Arm)   Pulse (!) 124   Temp 98.2 F (36.8 C) (Oral)   Resp 18   Ht 4' 11 (1.499 m)   Wt 78.7 kg   LMP 01/23/2023 (Exact Date)   SpO2 100%   BMI 35.02 kg/m  EFM: 125/Min/Mod/Max: Moderate Variability/Accelerations (+),Decelerations (-)  CVE: Dilation: 4 Effacement (%): 70 Station: -3 Presentation: Vertex Exam by:: dr trudy   A&P: 28 y.o. G2P1001 [redacted]w[redacted]d  #Labor: Progressing well. Recently checked about an hour ago unchanged from previous per report. Will continue titrating pitocin  as patient can tolerate.  #Pain: Epidural in place.  #FWB: Category II #GBS negative  Linda Ohmer LITTIE Angles, MD 8:41 PM

## 2023-11-06 NOTE — H&P (Signed)
 Caryle Helgeson Loge is a 28 y.o. female presenting for IOL for Postdates with a hx of C/s for Breech. . OB History     Gravida  2   Para  1   Term  1   Preterm      AB      Living  1      SAB      IAB      Ectopic      Multiple  0   Live Births  1          Past Medical History:  Diagnosis Date   Medical history non-contributory    Past Surgical History:  Procedure Laterality Date   CESAREAN SECTION N/A 04/17/2022   Procedure: CESAREAN SECTION;  Surgeon: Jayne Vonn DEL, MD;  Location: [facility] LD ORS;  Service: Obstetrics;  Laterality: N/A;   NO PAST SURGERIES     Family History: family history includes Cervical cancer in her mother; Diabetes in her father. Social History:  reports that she has never smoked. She has never used smokeless tobacco. She reports that she does not currently use alcohol after a past usage of about 1.0 standard drink of alcohol per week. She reports that she does not use drugs.      NURSING  PROVIDER  Office Location [place]  Dating by    [facility] Model Traditional Anatomy U/S Normal except for EIF  Initiated care at  wks                 Language  English               LAB RESULTS   Support Person   Genetics NIPS: LR panorama AFP: declined      Carrier Screen Horizon:   Rhogam  A/Positive/-- (06/25 1107) A1C/GTT Early HgbA1C:  Third trimester 2 hr GTT: neg  Flu Vaccine REc      TDaP Vaccine 09/07/23 Blood Type A/Positive/-- (06/25 1107)A pos  RSV Vaccine NA Antibody Negative (06/25 1107)neg  COVID Vaccine unsure Rubella 2.15 (06/25 1107)imm  Feeding Plan bottle RPR Non Reactive (06/25 1107)neg  Contraception Unsure  HBsAg Negative (06/25 1107)neg  Circumcision Yes  HIV Non Reactive (06/25 1107)neg  Pediatrician  [place] [facility] HCVAb Non Reactive (06/25 1107)neg  Prenatal Classes        BTL Consent   Pap Neg 09/2021  BTL Pre-payment   GC/CT Initial:  neg 36wks:  neg  VBAC Consent 09/07/23 GBS Negative/-- (08/14 1055)  BRx Optimized?  [ ]  yes   [ ]  no      DME Rx [ ]  BP cuff [ ]  Weight Scale Waterbirth  [ ]  Class [ ]  Consent [ ]  CNM visit  PHQ9 & GAD7 [  ] new OB [  ] 28 weeks  [  ] 36 weeks Induction  [ ]  Orders Entered [ ] Foley Y/N   Review of Systems  Constitutional:  Negative for chills, fatigue and fever.  Eyes:  Negative for pain and visual disturbance.  Respiratory:  Negative for apnea, shortness of breath and wheezing.   Cardiovascular:  Negative for chest pain and palpitations.  Gastrointestinal:  Negative for abdominal pain, constipation, diarrhea, nausea and vomiting.  Genitourinary:  Negative for difficulty urinating, dysuria, pelvic pain, vaginal bleeding, vaginal discharge and vaginal pain.  Musculoskeletal:  Negative for back pain.  Neurological:  Negative for seizures, weakness and headaches.  Psychiatric/Behavioral:  Negative for suicidal ideas.    Maternal Medical History:  Reason  for admission: Nausea.  Fetal activity: Perceived fetal activity is normal.   Prenatal Complications - Diabetes: none.   Dilation: 1 Effacement (%): Thick Station: -3 Exam by:: Shay,CNM Blood pressure 118/88, pulse 95, temperature 97.9 F (36.6 C), temperature source Oral, resp. rate 16, height 4' 11 (1.499 m), weight 78.7 kg, last menstrual period 01/23/2023, currently breastfeeding. Maternal Exam:  Uterine Assessment: Contraction frequency is irregular.  Abdomen: Patient reports no abdominal tenderness. Surgical scars: low transverse.   Fetal presentation: vertex Pelvis: adequate for delivery.   Cervix: Cervix evaluated by digital exam.     Fetal Exam Fetal Monitor Review: Baseline rate: 135.  Variability: moderate (6-25 bpm).   Pattern: accelerations present and no decelerations.   Fetal State Assessment: Category I - tracings are normal.   Physical Exam Vitals and nursing note reviewed.  Constitutional:      General: She is not in acute distress.    Appearance: Normal appearance.  HENT:     Head:  Normocephalic.  Pulmonary:     Effort: Pulmonary effort is normal.  Musculoskeletal:     Cervical back: Normal range of motion.  Skin:    General: Skin is warm and dry.  Neurological:     Mental Status: She is alert and oriented to person, place, and time.  Psychiatric:        Mood and Affect: Mood normal.     Prenatal labs: ABO, Rh: --/--/PENDING (09/15 0134) Antibody: PENDING (09/15 0134) Rubella: 2.15 (06/25 1107) RPR: Non Reactive (06/25 1107)  HBsAg: Negative (06/25 1107)  HIV: Non Reactive (06/25 1107)  GBS: Negative/-- (08/14 1055)   Assessment/Plan: Lorrain RODES Grandpre , a  83 y.o. G2P1001 at [redacted]w[redacted]d presents for IOL for PD.   Labor: Plan to start Pit and reassess for a FB  FWB: Cat I  I/D: GBS negative  MOF: Bottle MOC: unsure  Circ: yes MOD: Hopeful for VBAC   Claris CHRISTELLA Cedar 11/06/2023, 1:54 AM

## 2023-11-07 ENCOUNTER — Encounter: Payer: Self-pay | Admitting: Oncology

## 2023-11-07 ENCOUNTER — Inpatient Hospital Stay (HOSPITAL_COMMUNITY): Admitting: Anesthesiology

## 2023-11-07 ENCOUNTER — Encounter (HOSPITAL_COMMUNITY): Payer: Self-pay | Admitting: Family Medicine

## 2023-11-07 ENCOUNTER — Encounter (HOSPITAL_COMMUNITY)
Admission: RE | Disposition: A | Discharge status: Home / Self Care | Payer: Self-pay | Source: Home / Self Care | Attending: Obstetrics & Gynecology

## 2023-11-07 DIAGNOSIS — O411230 Chorioamnionitis, third trimester, not applicable or unspecified: Secondary | ICD-10-CM

## 2023-11-07 DIAGNOSIS — Z3A41 41 weeks gestation of pregnancy: Secondary | ICD-10-CM

## 2023-11-07 DIAGNOSIS — O34211 Maternal care for low transverse scar from previous cesarean delivery: Secondary | ICD-10-CM

## 2023-11-07 DIAGNOSIS — Z98891 History of uterine scar from previous surgery: Secondary | ICD-10-CM

## 2023-11-07 DIAGNOSIS — O480 Post-term pregnancy: Secondary | ICD-10-CM

## 2023-11-07 LAB — CBC
HCT: 31.5 % — ABNORMAL LOW (ref 36.0–46.0)
Hemoglobin: 10.3 g/dL — ABNORMAL LOW (ref 12.0–15.0)
MCH: 29.4 pg (ref 26.0–34.0)
MCHC: 32.7 g/dL (ref 30.0–36.0)
MCV: 90 fL (ref 80.0–100.0)
Platelets: 107 K/uL — ABNORMAL LOW (ref 150–400)
RBC: 3.5 MIL/uL — ABNORMAL LOW (ref 3.87–5.11)
RDW: 17.6 % — ABNORMAL HIGH (ref 11.5–15.5)
WBC: 13.5 K/uL — ABNORMAL HIGH (ref 4.0–10.5)
nRBC: 0 % (ref 0.0–0.2)

## 2023-11-07 LAB — CREATININE, SERUM
Creatinine, Ser: 1 mg/dL (ref 0.44–1.00)
GFR, Estimated: >60 mL/min (ref >60)

## 2023-11-07 SURGERY — Surgical Case
Anesthesia: Epidural

## 2023-11-07 MED ORDER — SODIUM CHLORIDE 0.9 % IV SOLN
2.0000 g | Freq: Four times a day (QID) | INTRAVENOUS | Status: DC
  Administered 2023-11-07: 2 g via INTRAVENOUS
  Filled 2023-11-07: qty 2000

## 2023-11-07 MED ORDER — OXYTOCIN-SODIUM CHLORIDE 30-0.9 UT/500ML-% IV SOLN: 2.5000 [IU]/h | INTRAVENOUS | Status: AC

## 2023-11-07 MED ORDER — CEFAZOLIN SODIUM-DEXTROSE 2-4 GM/100ML-% IV SOLN
2.0000 g | INTRAVENOUS | Status: AC
  Administered 2023-11-07: 2 g via INTRAVENOUS

## 2023-11-07 MED ORDER — FENTANYL CITRATE (PF) 100 MCG/2ML IJ SOLN
INTRAMUSCULAR | Status: DC | PRN
  Administered 2023-11-07 (×2): 100 ug via EPIDURAL

## 2023-11-07 MED ORDER — FENTANYL CITRATE (PF) 100 MCG/2ML IJ SOLN
INTRAMUSCULAR | Status: AC
  Filled 2023-11-07: qty 2

## 2023-11-07 MED ORDER — SIMETHICONE 80 MG PO CHEW
80.0000 mg | CHEWABLE_TABLET | Freq: Three times a day (TID) | ORAL | Status: DC
  Administered 2023-11-08 – 2023-11-09 (×4): 80 mg via ORAL
  Filled 2023-11-07 (×4): qty 1

## 2023-11-07 MED ORDER — CEFAZOLIN SODIUM-DEXTROSE 2-4 GM/100ML-% IV SOLN
INTRAVENOUS | Status: AC
  Filled 2023-11-07: qty 100

## 2023-11-07 MED ORDER — DIPHENHYDRAMINE HCL 25 MG PO CAPS: 25.0000 mg | ORAL_CAPSULE | ORAL | Status: DC | PRN

## 2023-11-07 MED ORDER — LACTATED RINGERS IV SOLN: 500.0000 mL | INTRAVENOUS | Status: DC | PRN

## 2023-11-07 MED ORDER — DIPHENHYDRAMINE HCL 50 MG/ML IJ SOLN: 12.5000 mg | INTRAMUSCULAR | Status: DC | PRN

## 2023-11-07 MED ORDER — MORPHINE SULFATE (PF) 0.5 MG/ML IJ SOLN
INTRAMUSCULAR | Status: AC
  Filled 2023-11-07: qty 10

## 2023-11-07 MED ORDER — ONDANSETRON HCL 4 MG/2ML IJ SOLN: 4.0000 mg | Freq: Three times a day (TID) | INTRAMUSCULAR | Status: DC | PRN

## 2023-11-07 MED ORDER — LACTATED RINGERS AMNIOINFUSION: INTRAVENOUS | Status: DC

## 2023-11-07 MED ORDER — OXYCODONE HCL 5 MG/5ML PO SOLN
5.0000 mg | Freq: Once | ORAL | Status: DC | PRN
  Filled 2023-11-07: qty 5

## 2023-11-07 MED ORDER — ONDANSETRON HCL 4 MG/2ML IJ SOLN
INTRAMUSCULAR | Status: DC | PRN
  Administered 2023-11-07: 4 mg via INTRAVENOUS

## 2023-11-07 MED ORDER — ONDANSETRON HCL 4 MG/2ML IJ SOLN
INTRAMUSCULAR | Status: AC
  Filled 2023-11-07: qty 2

## 2023-11-07 MED ORDER — SODIUM CHLORIDE 0.9 % IV SOLN
INTRAVENOUS | Status: AC
  Filled 2023-11-07: qty 5

## 2023-11-07 MED ORDER — OXYCODONE HCL 5 MG PO TABS: 5.0000 mg | ORAL_TABLET | Freq: Once | ORAL | Status: DC | PRN

## 2023-11-07 MED ORDER — IBUPROFEN 600 MG PO TABS
600.0000 mg | ORAL_TABLET | Freq: Four times a day (QID) | ORAL | Status: DC
  Administered 2023-11-08 – 2023-11-09 (×3): 600 mg via ORAL
  Filled 2023-11-07 (×3): qty 1

## 2023-11-07 MED ORDER — SOD CITRATE-CITRIC ACID 500-334 MG/5ML PO SOLN
30.0000 mL | ORAL | Status: DC
Start: 2023-11-07 — End: 2023-11-07

## 2023-11-07 MED ORDER — TRANEXAMIC ACID-NACL 1000-0.7 MG/100ML-% IV SOLN
INTRAVENOUS | Status: DC | PRN
  Administered 2023-11-07: 1000 mg via INTRAVENOUS

## 2023-11-07 MED ORDER — PRENATAL MULTIVITAMIN CH
1.0000 | ORAL_TABLET | Freq: Every day | ORAL | Status: DC
  Administered 2023-11-08: 1 via ORAL
  Filled 2023-11-07: qty 1

## 2023-11-07 MED ORDER — CALCIUM CARBONATE ANTACID 500 MG PO CHEW
2.0000 | CHEWABLE_TABLET | Freq: Once | ORAL | Status: AC
  Administered 2023-11-07: 400 mg via ORAL
  Filled 2023-11-07: qty 2

## 2023-11-07 MED ORDER — SODIUM CHLORIDE 0.9 % IV SOLN
500.0000 mg | INTRAVENOUS | Status: AC
  Administered 2023-11-07: 500 mg via INTRAVENOUS

## 2023-11-07 MED ORDER — DEXAMETHASONE SODIUM PHOSPHATE 10 MG/ML IJ SOLN
INTRAMUSCULAR | Status: DC | PRN
Start: 2023-11-07 — End: 2023-11-07
  Administered 2023-11-07: 10 mg via INTRAVENOUS

## 2023-11-07 MED ORDER — FENTANYL CITRATE (PF) 100 MCG/2ML IJ SOLN
25.0000 ug | INTRAMUSCULAR | Status: DC | PRN
  Administered 2023-11-07: 25 ug via INTRAVENOUS

## 2023-11-07 MED ORDER — NALOXONE HCL 4 MG/10ML IJ SOLN: 1.0000 ug/kg/h | INTRAVENOUS | Status: DC | PRN

## 2023-11-07 MED ORDER — OXYTOCIN-SODIUM CHLORIDE 30-0.9 UT/500ML-% IV SOLN
INTRAVENOUS | Status: DC | PRN
  Administered 2023-11-07: 300 mL via INTRAVENOUS

## 2023-11-07 MED ORDER — DEXAMETHASONE SODIUM PHOSPHATE 10 MG/ML IJ SOLN
INTRAMUSCULAR | Status: AC
  Filled 2023-11-07: qty 1

## 2023-11-07 MED ORDER — GABAPENTIN 100 MG PO CAPS
100.0000 mg | ORAL_CAPSULE | Freq: Three times a day (TID) | ORAL | Status: DC
  Administered 2023-11-07 – 2023-11-09 (×5): 100 mg via ORAL
  Filled 2023-11-07 (×5): qty 1

## 2023-11-07 MED ORDER — ENOXAPARIN SODIUM 40 MG/0.4ML IJ SOSY
40.0000 mg | PREFILLED_SYRINGE | INTRAMUSCULAR | Status: DC
  Administered 2023-11-08 – 2023-11-09 (×2): 40 mg via SUBCUTANEOUS
  Filled 2023-11-07 (×2): qty 0.4

## 2023-11-07 MED ORDER — ACETAMINOPHEN 500 MG PO TABS
1000.0000 mg | ORAL_TABLET | Freq: Four times a day (QID) | ORAL | Status: DC | PRN
  Administered 2023-11-07: 1000 mg via ORAL
  Filled 2023-11-07: qty 2

## 2023-11-07 MED ORDER — DEXMEDETOMIDINE HCL IN NACL 80 MCG/20ML IV SOLN
INTRAVENOUS | Status: DC | PRN
  Administered 2023-11-07: 8 ug via INTRAVENOUS

## 2023-11-07 MED ORDER — COCONUT OIL OIL: 1.0000 | TOPICAL_OIL | Status: DC | PRN

## 2023-11-07 MED ORDER — NALOXONE HCL 0.4 MG/ML IJ SOLN: 0.4000 mg | INTRAMUSCULAR | Status: DC | PRN

## 2023-11-07 MED ORDER — DIBUCAINE (PERIANAL) 1 % EX OINT: 1.0000 | TOPICAL_OINTMENT | CUTANEOUS | Status: DC | PRN

## 2023-11-07 MED ORDER — ACETAMINOPHEN 500 MG PO TABS
1000.0000 mg | ORAL_TABLET | Freq: Four times a day (QID) | ORAL | Status: DC
  Filled 2023-11-07: qty 2

## 2023-11-07 MED ORDER — WITCH HAZEL-GLYCERIN EX PADS: 1.0000 | MEDICATED_PAD | CUTANEOUS | Status: DC | PRN

## 2023-11-07 MED ORDER — ZOLPIDEM TARTRATE 5 MG PO TABS: 5.0000 mg | ORAL_TABLET | Freq: Every evening | ORAL | Status: DC | PRN

## 2023-11-07 MED ORDER — DROPERIDOL 2.5 MG/ML IJ SOLN: 0.6250 mg | Freq: Once | INTRAMUSCULAR | Status: DC | PRN

## 2023-11-07 MED ORDER — ACETAMINOPHEN 500 MG PO TABS
1000.0000 mg | ORAL_TABLET | Freq: Four times a day (QID) | ORAL | Status: DC
  Administered 2023-11-07: 1000 mg via ORAL

## 2023-11-07 MED ORDER — LACTATED RINGERS IV SOLN: INTRAVENOUS | Status: DC

## 2023-11-07 MED ORDER — TRIAMCINOLONE ACETONIDE 40 MG/ML IJ SUSP
40.0000 mg | Freq: Once | INTRAMUSCULAR | Status: DC
  Filled 2023-11-07: qty 1

## 2023-11-07 MED ORDER — GENTAMICIN SULFATE 40 MG/ML IJ SOLN
5.0000 mg/kg | INTRAVENOUS | Status: DC
  Administered 2023-11-07: 300 mg via INTRAVENOUS
  Filled 2023-11-07: qty 7.5

## 2023-11-07 MED ORDER — KETOROLAC TROMETHAMINE 30 MG/ML IJ SOLN
30.0000 mg | Freq: Four times a day (QID) | INTRAMUSCULAR | Status: AC
  Administered 2023-11-07 – 2023-11-08 (×3): 30 mg via INTRAVENOUS
  Filled 2023-11-07 (×3): qty 1

## 2023-11-07 MED ORDER — ACETAMINOPHEN 500 MG PO TABS: 1000.0000 mg | ORAL_TABLET | Freq: Four times a day (QID) | ORAL | Status: DC

## 2023-11-07 MED ORDER — SIMETHICONE 80 MG PO CHEW: 80.0000 mg | CHEWABLE_TABLET | ORAL | Status: DC | PRN

## 2023-11-07 MED ORDER — TRIAMCINOLONE ACETONIDE 40 MG/ML IJ SUSP
INTRAMUSCULAR | Status: AC
  Filled 2023-11-07: qty 1

## 2023-11-07 MED ORDER — LIDOCAINE-EPINEPHRINE (PF) 2 %-1:200000 IJ SOLN
INTRAMUSCULAR | Status: DC | PRN
  Administered 2023-11-07 (×2): 5 mL via EPIDURAL

## 2023-11-07 MED ORDER — SODIUM CHLORIDE 0.9% FLUSH: 3.0000 mL | INTRAVENOUS | Status: DC | PRN

## 2023-11-07 MED ORDER — SENNOSIDES-DOCUSATE SODIUM 8.6-50 MG PO TABS
2.0000 | ORAL_TABLET | Freq: Every day | ORAL | Status: DC
  Administered 2023-11-08 – 2023-11-09 (×2): 2 via ORAL
  Filled 2023-11-07 (×3): qty 2

## 2023-11-07 MED ORDER — STERILE WATER FOR INJECTION IJ SOLN
INTRAMUSCULAR | Status: AC
  Filled 2023-11-07: qty 20

## 2023-11-07 MED ORDER — TRIAMCINOLONE ACETONIDE 40 MG/ML IJ SUSP
INTRAMUSCULAR | Status: DC | PRN
  Administered 2023-11-07: 40 mg via INTRAMUSCULAR

## 2023-11-07 MED ORDER — MORPHINE SULFATE (PF) 0.5 MG/ML IJ SOLN
INTRAMUSCULAR | Status: DC | PRN
  Administered 2023-11-07: 3 mg via EPIDURAL

## 2023-11-07 MED ORDER — DIPHENHYDRAMINE HCL 25 MG PO CAPS: 25.0000 mg | ORAL_CAPSULE | Freq: Four times a day (QID) | ORAL | Status: DC | PRN

## 2023-11-07 MED ORDER — MENTHOL 3 MG MT LOZG: 1.0000 | LOZENGE | OROMUCOSAL | Status: DC | PRN

## 2023-11-07 SURGICAL SUPPLY — 32 items
BENZOIN TINCTURE PRP APPL 2/3 (GAUZE/BANDAGES/DRESSINGS) IMPLANT
CHLORAPREP W/TINT 26 (MISCELLANEOUS) ×2 IMPLANT
CLAMP UMBILICAL CORD (MISCELLANEOUS) ×1 IMPLANT
CLOTH BEACON ORANGE TIMEOUT ST (SAFETY) ×1 IMPLANT
DERMABOND ADVANCED .7 DNX12 (GAUZE/BANDAGES/DRESSINGS) IMPLANT
DRSG OPSITE POSTOP 4X10 (GAUZE/BANDAGES/DRESSINGS) ×1 IMPLANT
ELECTRODE REM PT RTRN 9FT ADLT (ELECTROSURGICAL) ×1 IMPLANT
EXTRACTOR VACUUM M CUP 4 TUBE (SUCTIONS) IMPLANT
GLOVE BIO SURGEON STRL SZ7.5 (GLOVE) ×1 IMPLANT
GLOVE BIOGEL PI IND STRL 7.0 (GLOVE) ×2 IMPLANT
GLOVE BIOGEL PI IND STRL 8 (GLOVE) ×1 IMPLANT
GOWN STRL REUS W/TWL LRG LVL3 (GOWN DISPOSABLE) ×2 IMPLANT
GOWN STRL REUS W/TWL XL LVL3 (GOWN DISPOSABLE) ×1 IMPLANT
KIT ABG SYR 3ML LUER SLIP (SYRINGE) IMPLANT
MAT PREVALON FULL STRYKER (MISCELLANEOUS) IMPLANT
NDL HYPO 25X5/8 SAFETYGLIDE (NEEDLE) IMPLANT
NDL SAFETY ECLIPSE 18X1.5 (NEEDLE) IMPLANT
NEEDLE HYPO 25X5/8 SAFETYGLIDE (NEEDLE) IMPLANT
NS IRRIG 1000ML POUR BTL (IV SOLUTION) ×1 IMPLANT
PACK C SECTION WH (CUSTOM PROCEDURE TRAY) ×1 IMPLANT
PAD OB MATERNITY 4.3X12.25 (PERSONAL CARE ITEMS) ×1 IMPLANT
RTRCTR C-SECT PINK 25CM LRG (MISCELLANEOUS) ×1 IMPLANT
STRIP CLOSURE SKIN 1/2X4 (GAUZE/BANDAGES/DRESSINGS) IMPLANT
SUT MNCRL 0 VIOLET CTX 36 (SUTURE) ×2 IMPLANT
SUT VIC AB 0 CTX36XBRD ANBCTRL (SUTURE) ×1 IMPLANT
SUT VIC AB 2-0 CT1 TAPERPNT 27 (SUTURE) ×1 IMPLANT
SUT VIC AB 4-0 KS 27 (SUTURE) ×1 IMPLANT
SUTURE PLAIN GUT 2.0 ETHICON (SUTURE) IMPLANT
SYR CONTROL 10ML LL (SYRINGE) IMPLANT
TOWEL OR 17X24 6PK STRL BLUE (TOWEL DISPOSABLE) ×1 IMPLANT
TRAY FOLEY W/BAG SLVR 14FR LF (SET/KITS/TRAYS/PACK) ×1 IMPLANT
WATER STERILE IRR 1000ML POUR (IV SOLUTION) ×1 IMPLANT

## 2023-11-07 NOTE — Lactation Note (Signed)
 This note was copied from a baby's chart. Lactation Consultation Note  Patient Name: Linda Kidd Today's Date: 11/07/2023 Age:26 hours Reason for consult: Initial assessment;Exclusive pumping and bottle feeding;Term P2, term female infant, MOB wants to pump only and formula feed infant. LC set MOB up with 21 mm breast flange and DEBP, MOB had expressed 10 mls and still pumping when LC left the room. MOB will continue to feed infant by cues, on demand,8-12 times within 24 hours. MOB has handout feeding guidelines and knows that on Day 1 to offer 5-15 mls per feeding or more if infant wants it of EBM/Formula 20 kcal.LVC sent referral for STORK DEBP. MOB knows that her EBM is safe for 4 hours at room temperature whereas formula RTF once open only safe for 1 hour. MOB was  made aware of O/P services, breastfeeding support groups, community resources, and our phone # for post-discharge questions.     Maternal Data Has patient been taught Hand Expression?: Yes Does the patient have breastfeeding experience prior to this delivery?: Yes How long did the patient breastfeed?: Per MOB, she breastfeed 1st child for 2 months due to returning to work.  Feeding Mother's Current Feeding Choice: Breast Milk and Formula  LATCH Score  MOB is  pumping only and formula feeding infant.                 Lactation Tools Discussed/Used Tools: Pump Breast pump type: Double-Electric Breast Pump Pump Education: Setup, frequency, and cleaning Reason for Pumping: MOB feeding choice is pumping only and formula feeding infant. Pumping frequency: MOB will continue to pump every 3 hours for 15 minutes on inital setting. Pumped volume: 10 mL (MOB was still expressing colostrum when LC left the room.)  Interventions    Discharge Pump: Referral sent for [facility] Pump  Consult Status Consult Status: Follow-up Date: 11/08/23 Follow-up type: In-patient    Linda Kidd 11/07/2023, 5:33 PM

## 2023-11-07 NOTE — Progress Notes (Signed)
 Dr. Trudy called to ask if patient can have Tylenol  and Motrin  due to her allergies and she said it was ok for patient to have. Patient also stated she can take those meds. Will monitor.

## 2023-11-07 NOTE — Progress Notes (Signed)
 Labor Progress Note Linda Kidd is a 28 y.o. G2P1001 at [redacted]w[redacted]d presented for postdates induction   O:  BP 111/69 (BP Location: Left Arm)   Pulse (!) 119   Temp 98.2 F (36.8 C) (Oral)   Resp 16   Ht 4' 11 (1.499 m)   Wt 78.7 kg   LMP 01/23/2023 (Exact Date)   SpO2 100%   BMI 35.02 kg/m  EFM: 135/Min/Mod/Max: Moderate Variability/Accelerations (+),Decelerations (-)  CVE: Dilation: 5 Effacement (%): 70 Station: -3 Presentation: Vertex Exam by:: Dr Magali   A&P: 28 y.o. G2P1001 [redacted]w[redacted]d   #Labor: Finished pitocin  break, restarted pitocin  at 35mu/min. IUPC came out so replaced with new probe. Good return on amnioinfusion.  #Pain: Epidural in place.  #FWB: Category I #GBS negative  Maleki Hippe LITTIE Magali, MD 4:44 AM

## 2023-11-07 NOTE — Anesthesia Postprocedure Evaluation (Signed)
 Anesthesia Post Note  Patient: Linda Kidd  Procedure(s) Performed: CESAREAN DELIVERY     Patient location during evaluation: L&D Anesthesia Type: Epidural Level of consciousness: oriented and awake and alert Pain management: pain level controlled Vital Signs Assessment: post-procedure vital signs reviewed and stable Respiratory status: spontaneous breathing, respiratory function stable and patient connected to nasal cannula oxygen Cardiovascular status: blood pressure returned to baseline and stable Postop Assessment: no headache, no backache, no apparent nausea or vomiting and epidural receding Anesthetic complications: no   No notable events documented.  Last Vitals:  Vitals:   11/07/23 1347 11/07/23 1405  BP: 114/77 113/67  Pulse: (!) 117 (!) 113  Resp: (!) 22 20  Temp:    SpO2: 97% 99%    Last Pain:  Vitals:   11/07/23 1347  TempSrc:   PainSc: 3                  Rome Ade

## 2023-11-07 NOTE — Transfer of Care (Signed)
 Immediate Anesthesia Transfer of Care Note  Patient: Moranda Q Pal  Procedure(s) Performed: CESAREAN DELIVERY  Patient Location: PACU  Anesthesia Type:Spinal  Level of Consciousness: awake, alert , and oriented  Airway & Oxygen Therapy: Patient Spontanous Breathing  Post-op Assessment: Report given to RN and Post -op Vital signs reviewed and stable  Post vital signs: Reviewed and stable  Last Vitals:  Vitals Value Taken Time  BP 120/82 11/07/23 12:54  Temp    Pulse 123 11/07/23 12:56  Resp 18   SpO2 97 % 11/07/23 12:56  Vitals shown include unfiled device data.  Last Pain:  Vitals:   11/07/23 0901  TempSrc: Axillary  PainSc: 0-No pain         Complications: No notable events documented.

## 2023-11-07 NOTE — Discharge Summary (Signed)
 Postpartum Discharge Summary  Date of Service updated***     Patient Name: Linda Kidd DOB: 1995-05-17 MRN: [id number]  Date of admission: 11/06/2023 Delivery date:11/07/2023 Delivering provider: ILEAN NORLEEN GAILS Date of discharge: 11/07/2023  Admitting diagnosis: Post-dates pregnancy [O48.0] Intrauterine pregnancy: [redacted]w[redacted]d     Secondary diagnosis:  Active Problems:   S/P repeat low transverse C-section  Additional problems: Triple I (given 1 dose Amp & Gent shortly prior to delivery)    Discharge diagnosis: Term Pregnancy Delivered                                              Post partum procedures:{Postpartum procedures:23558} Augmentation: AROM, Pitocin , and IP Foley Complications: {OB Labor/Delivery Complications:20784}  Hospital course: {Courses:23701}  Magnesium Sulfate received: {Mag received:30440022} BMZ received: {BMZ received:30440023} Rhophylac:{Rhophylac received:30440032} FFM:{FFM:69559966} T-DaP:{Tdap:23962} Flu: {Qol:76036} RSV Vaccine received: {RSV:31013} Transfusion:{Transfusion received:30440034}  Immunizations received: Immunization History  Administered Date(s) Administered   Dtap, Unspecified 07/09/1997, 09/29/1997, 12/03/1997, 05/14/1998, 10/29/1998, 07/04/2002   HIB (PRP-OMP) 07/09/1997, 09/29/1997, 12/03/1997, 05/14/1998   HPV 9-valent 03/12/2019   HPV Quadrivalent 05/09/2013, 08/19/2014   Hepatitis A, Ped/Adol-2 Dose 05/09/2013, 08/19/2014   Hepatitis B 07/09/1997, 12/03/1997, 05/14/1998   IPV 07/09/1997, 09/29/1997, 12/03/1997, 05/14/1998, 07/04/2002   Influenza,inj,Quad PF,6+ Mos 11/30/2016, 03/12/2021, 02/17/2022   Influenza-Unspecified 11/30/2016   MMR 05/14/1998, 07/04/2002   Meningococcal Conjugate 05/09/2013, 08/19/2014   Moderna Sars-Covid-2 Vaccination 08/16/2019, 09/13/2019   Pneumococcal Conjugate-13 08/27/1999   Tdap 11/11/2008, 11/30/2016, 02/17/2022, 09/07/2023   Varicella 05/14/1998, 05/09/2013    Physical exam   Vitals:   11/07/23 1255 11/07/23 1300 11/07/23 1315 11/07/23 1330  BP: 120/82 132/69 117/85 116/82  Pulse: (!) 123 (!) 119 (!) 116 (!) 119  Resp:  20 19 19   Temp: 98.3 F (36.8 C)     TempSrc: Oral     SpO2: 97% 99% 99% 98%  Weight:      Height:       General: {Exam; general:21111117} Lochia: {Desc; appropriate/inappropriate:30686::appropriate} Uterine Fundus: {Desc; firm/soft:30687} Incision: {Exam; incision:21111123} DVT Evaluation: {Exam; dvt:2111122} Labs: Lab Results  Component Value Date   WBC 9.0 11/06/2023   HGB 10.9 (L) 11/06/2023   HCT 33.6 (L) 11/06/2023   MCV 90.8 11/06/2023   PLT 141 (L) 11/06/2023      Latest Ref Rng & Units 03/12/2021   11:25 AM  CMP  Glucose 70 - 99 mg/dL 96   BUN 6 - 23 mg/dL 13   Creatinine 9.59 - 1.20 mg/dL 9.27   Sodium 864 - 854 mEq/L 137   Potassium 3.5 - 5.1 mEq/L 3.7   Chloride 96 - 112 mEq/L 104   CO2 19 - 32 mEq/L 28   Calcium  8.4 - 10.5 mg/dL 9.2   Total Protein 6.0 - 8.3 g/dL 7.9   Total Bilirubin 0.2 - 1.2 mg/dL 0.5   Alkaline Phos 39 - 117 U/L 50   AST 0 - 37 U/L 19   ALT 0 - 35 U/L 9    Edinburgh Score:    06/01/2022   10:51 AM  Edinburgh Postnatal Depression Scale Screening Tool  I have been able to laugh and see the funny side of things. 0  I have looked forward with enjoyment to things. 0  I have blamed myself unnecessarily when things went wrong. 0  I have been anxious or worried for no good reason. 0  I have felt scared or panicky for no good reason. 0  Things have been getting on top of me. 0  I have been so unhappy that I have had difficulty sleeping. 0  I have felt sad or miserable. 0  I have been so unhappy that I have been crying. 0  The thought of harming myself has occurred to me. 0  Edinburgh Postnatal Depression Scale Total 0      Data saved with a previous flowsheet row definition   No data recorded  After visit meds:  Allergies as of 11/07/2023       Reactions   Excedrin Back &  [acetaminophen -aspirin  Buffered] Other (See Comments)   Gi burning     Med Rec must be completed prior to using this SMARTLINK***        Discharge home in stable condition Infant Feeding: {Baby feeding:23562} Infant Disposition:{CHL IP OB HOME WITH FNUYZM:76418} Discharge instruction: per After Visit Summary and Postpartum booklet. Activity: Advance as tolerated. Pelvic rest for 6 weeks.  Diet: {OB ipzu:78888878} Future Appointments: Future Appointments  Date Time Provider Department Center  03/05/2024  8:00 AM [facility] None  03/06/2024 10:15 AM Jacobo, Evalene PARAS, MD [facility] None  03/06/2024 10:30 AM [facility] INFUSION CHAIR 4 [facility] None   Follow up Visit:   Please schedule this patient for a {Visit type:23955} postpartum visit in {Postpartum visit:23953} with the following provider: {Provider type:23954}. Additional Postpartum F/U:{PP Procedure:23957}  {Risk level:23960} pregnancy complicated by: {complication:23959} Delivery mode:  C-Section, Low Transverse Anticipated Birth Control:  {Birth Control:23956}   11/07/2023 [name] Pouch, MD

## 2023-11-07 NOTE — Anesthesia Postprocedure Evaluation (Signed)
 Anesthesia Post Note  Patient: Linda Kidd  Procedure(s) Performed: AN AD HOC EPIDURAL     Patient location during evaluation: Mother Baby Anesthesia Type: Epidural Level of consciousness: awake and alert Pain management: satisfactory to patient Vital Signs Assessment: post-procedure vital signs reviewed and stable Respiratory status: spontaneous breathing Cardiovascular status: stable Postop Assessment: no headache, epidural receding, patient able to bend at knees and no apparent nausea or vomiting   No notable events documented.  Last Vitals:  Vitals:   11/07/23 1405 11/07/23 1530  BP: 113/67 112/69  Pulse: (!) 113 100  Resp: 20 18  Temp:  36.6 C  SpO2: 99% 99%    Last Pain:  Vitals:   11/07/23 1629  TempSrc:   PainSc: 1    Pain Goal:                   [facility]

## 2023-11-07 NOTE — Op Note (Signed)
 Cesarean Section Operative Report  PATIENT: Linda Kidd  PROCEDURE DATE: 11/07/2023  PREOPERATIVE DIAGNOSES: Intrauterine pregnancy at [redacted]w[redacted]d weeks gestation; failure to progress: arrest of dilation,   POSTOPERATIVE DIAGNOSES: The same  PROCEDURE: Repeat Low Transverse Cesarean Section  SURGEON:   Surgeons and Role:    * Linda Norleen GAILS, MD - Primary - Attending   Linda Pouch, MD--FM OB Fellow   INDICATIONS: Linda Kidd is a 28 y.o. G2P1001 at [redacted]w[redacted]d here for cesarean section secondary to the indications listed under preoperative diagnoses; please see preoperative note for further details.  The risks of cesarean section were discussed with the patient including but were not limited to: bleeding which may require transfusion or reoperation; infection which may require antibiotics; injury to bowel, bladder, ureters or other surrounding organs; injury to the fetus; need for additional procedures including hysterectomy in the event of a life-threatening hemorrhage; placental abnormalities wth subsequent pregnancies, incisional problems, thromboembolic phenomenon and other postoperative/anesthesia complications.   The patient concurred with the proposed plan, giving informed written consent for the procedure.    FINDINGS:  Viable female infant in OP cephalic presentation.  Apgars 3 and 7.  Clear amniotic fluid.  Intact placenta, three vessel cord.  Normal uterus, fallopian tubes and ovaries bilaterally.  ANESTHESIA: Epidural INTRAVENOUS FLUIDS: 1600 ml ESTIMATED BLOOD LOSS: URINE OUTPUT:  200 ml SPECIMENS: Placenta sent to pathology due to concern for choreo COMPLICATIONS: None immediate  PROCEDURE IN DETAIL:  The patient preoperatively received intravenous antibiotics and had sequential compression devices applied to her lower extremities.  She was then taken to the operating room where the epidural anesthesia was dosed up to surgical level and was found to be adequate. She was  then placed in a dorsal supine position with a leftward tilt, and prepped and draped in a sterile manner.  A foley catheter was placed into her bladder and attached to constant gravity.    After an adequate timeout was performed, a Pfannenstiel skin incision was made with scalpel superior to her preexisting keloid scar and carried through to the underlying layer of fascia. The fascia was incised in the midline, and this incision was extended bilaterally using the Mayo scissors.  Kocher clamps were applied to the superior aspect of the fascial incision and the underlying rectus muscles were dissected off bluntly.  The rectus muscles were separated in the midline bluntly and the peritoneum was entered bluntly. An Alexis-O retractor was inserted. Attention was turned to the lower uterine segment where a low transverse hysterotomy was made with a scalpel and extended bilaterally bluntly.  The infant was successfully delivered, the cord was clamped and cut immediately due to depressed fetal status, and the infant was handed over to the awaiting nursery team. Uterine massage was then administered, and the placenta delivered intact with a three-vessel cord. The uterus was then cleared of clots and debris.  The hysterotomy was closed with 0 Monocryl in a running locked fashion. The Alexis O retractor was removed. The pelvis was cleared of all clot and debris. Hemostasis was confirmed on all surfaces.  The peritoneum was closed with a 0 Vicryl running stitch. The fascia was then closed using 0 Vicryl in a running fashion. The keloid was removed with scalpel and cautery. The subcutaneous layer was irrigated. Cautery used to obtain hemostasis in the subcutaneous layer and then reapproximated with 2-0 plain gut running stitch.  The skin was closed with a 4-0 Vicryl subcuticular stitch.   The patient tolerated the  procedure well. Sponge, lap, instrument and needle counts were correct x 3.  She was taken to the recovery room  in stable condition.   An experienced assistant was required given the standard of surgical care given the complexity of the case.  This assistant was needed for exposure, dissection, suctioning, retraction, instrument exchange, assisting with delivery with administration of fundal pressure, and for overall help during the procedure.   Maternal Disposition: PACU - hemodynamically stable.   Infant Disposition: stable   Linda Pouch, MD FM OB Fellow 11/07/2023, 1:22 PM

## 2023-11-07 NOTE — Progress Notes (Signed)
 Labor Progress Note Jannely Henthorn Burkel is a 28 y.o. G2P1001 at [redacted]w[redacted]d presented for postdates induction  S: Doing well. Resting comfortably.   O:  BP (!) 93/53 (BP Location: Left Arm)   Pulse (!) 119   Temp 98.2 F (36.8 C) (Oral)   Resp 18   Ht 4' 11 (1.499 m)   Wt 78.7 kg   LMP 01/23/2023 (Exact Date)   SpO2 100%   BMI 35.02 kg/m  EFM: 135/Min/Mod/Max: Moderate Variability/Accelerations (+),Decelerations (-)  CVE: Dilation: 5 Effacement (%): 70 Station: -3 Presentation: Vertex Exam by:: Conservation officer, nature   A&P: 28 y.o. G2P1001 [redacted]w[redacted]d   #Labor: Progressing well. Up to 26 of pitocin . Patient has been on pitocin  for almost 24 hours. No meaningful cervical change on recent check. Will stop pitocin  for 4 hours and restart at half. Also had moderate amount of meconium on AROM. With the pitocin  break will start amnioinfusion. #Pain: Epidural in place.  #FWB: Category I #GBS negative  Thao Vanover LITTIE Angles, MD 12:25 AM

## 2023-11-07 NOTE — Progress Notes (Signed)
 Labor Progress Note Elide Q Rembold is a 28 y.o. G2P1001 at [redacted]w[redacted]d presenting for TOLAC for post-dates  S: Febrile, started on ampicillin  and gentamicin . Pt feeling tired.  O:  BP 114/66   Pulse (!) 146   Temp (!) 101.2 F (38.4 C) (Axillary)   Resp 16   Ht 4' 11 (1.499 m)   Wt 78.7 kg   LMP 01/23/2023 (Exact Date)   SpO2 100%   BMI 35.02 kg/m  Lab Results  Component Value Date   HGB 10.9 (L) 11/06/2023    Time: 10:43 AM  FHT: baseline bpm 155, moderate variability, accelerations present, decelerations none,   Contractions: MVUs 175   CVE: Dilation: 5 Effacement (%): 70 Station: -3 Presentation: Vertex Exam by:: dr trudy   A&P: 28 y.o. G2P1001 [redacted]w[redacted]d TOLAC for post dates #Labor: Latent Labor AROM, Pitocin , and IP Foley IUPC  #Pain: Epidural #FWB: Category I #GBS negative  Discussed with patient, that my exam has not significantly changed from yesterday. Fever and triple I make uterus contract more poorly. Pt feels very exhausted and would like to proceed with a c-section.   C-section risks discussed including risk of bleeding, infection, damage to nearby organs--bowel, bladder, ureters sometimes requiring specialist intervention intraoperatively, damage to fetus. Some people bleed excessively and require a blood transfusion which she is agreeable to. Some people require a hysterectomy in order to save their life if bleeding does not stop with other measures.   Patient expressed understanding and agrees. Consent signed.   [name] trudy, MD 10:43 AM

## 2023-11-08 ENCOUNTER — Other Ambulatory Visit: Payer: Self-pay

## 2023-11-08 DIAGNOSIS — O411290 Chorioamnionitis, unspecified trimester, not applicable or unspecified: Secondary | ICD-10-CM | POA: Insufficient documentation

## 2023-11-08 HISTORY — DX: Chorioamnionitis, unspecified trimester, not applicable or unspecified: O41.1290

## 2023-11-08 LAB — CBC
HCT: 28.7 % — ABNORMAL LOW (ref 36.0–46.0)
Hemoglobin: 9.4 g/dL — ABNORMAL LOW (ref 12.0–15.0)
MCH: 29.3 pg (ref 26.0–34.0)
MCHC: 32.8 g/dL (ref 30.0–36.0)
MCV: 89.4 fL (ref 80.0–100.0)
Platelets: 119 K/uL — ABNORMAL LOW (ref 150–400)
RBC: 3.21 MIL/uL — ABNORMAL LOW (ref 3.87–5.11)
RDW: 17.5 % — ABNORMAL HIGH (ref 11.5–15.5)
WBC: 19.5 K/uL — ABNORMAL HIGH (ref 4.0–10.5)
nRBC: 0 % (ref 0.0–0.2)

## 2023-11-08 MED ORDER — FERROUS SULFATE 325 (65 FE) MG PO TABS
325.0000 mg | ORAL_TABLET | ORAL | Status: DC
  Filled 2023-11-08: qty 1

## 2023-11-08 MED ORDER — METRONIDAZOLE 500 MG/100ML IV SOLN
500.0000 mg | Freq: Three times a day (TID) | INTRAVENOUS | Status: AC
  Administered 2023-11-08 – 2023-11-09 (×3): 500 mg via INTRAVENOUS
  Filled 2023-11-08 (×3): qty 100

## 2023-11-08 MED ORDER — GENTAMICIN SULFATE 40 MG/ML IJ SOLN
5.0000 mg/kg | INTRAVENOUS | Status: AC
  Administered 2023-11-08: 300 mg via INTRAVENOUS
  Filled 2023-11-08: qty 7.5

## 2023-11-08 MED ORDER — SODIUM CHLORIDE 0.9 % IV SOLN: INTRAVENOUS | Status: AC | PRN

## 2023-11-08 MED ORDER — DEXTROSE 5 % IV SOLN: 5.0000 mg/kg | Freq: Once | INTRAVENOUS | Status: DC

## 2023-11-08 MED ORDER — SODIUM CHLORIDE 0.9 % IV SOLN
2.0000 g | Freq: Four times a day (QID) | INTRAVENOUS | Status: AC
  Administered 2023-11-08 – 2023-11-09 (×4): 2 g via INTRAVENOUS
  Filled 2023-11-08 (×4): qty 2000

## 2023-11-08 NOTE — Lactation Note (Signed)
 This note was copied from a baby's chart. Lactation Consultation Note  Patient Name: Linda Kidd Today's Date: 11/08/2023 Age:26 hours Reason for consult: Follow-up assessment;NICU baby;Exclusive pumping and bottle feeding;Term  Front desk of 5th floor called and told LC that MOB's pump came for 520. LC told her to have the RN bring MOB the pump into her room.  Feeding Mother's Current Feeding Choice: Breast Milk and Formula  Lactation Tools Discussed/Used Tools: Pump;Flanges;Coconut oil Flange Size: 21 Breast pump type: Double-Electric Breast Pump Pump Education: Setup, frequency, and cleaning;Milk Storage Reason for Pumping: Patient's choice to exclusively pump and bottle feed Pumping frequency: 1 time/24 hours; recommended q 3 hours Pumped volume: 3 mL  Interventions Interventions: Breast feeding basics reviewed;Coconut oil;DEBP;Education  Discharge Pump: Received Stork Pump  Consult Status Consult Status: NICU follow-up Date: 11/08/23 Follow-up type: In-patient    Recardo Hoit BS, IBCLC 11/08/2023, 3:12 PM

## 2023-11-08 NOTE — Lactation Note (Signed)
 This note was copied from a baby's chart.  NICU Lactation Consultation Note  Patient Name: Linda Kidd Date: 11/08/2023 Age:26 hours  Reason for consult: Follow-up assessment; NICU baby; Exclusive pumping and bottle feeding; Term  SUBJECTIVE Visited with family of 59 hours old FT NICU female; baby Phil got admitted due to tachypnea and to R/O sepsis. Linda Kidd is a P2 and still receiving IV antibiotics during [facility] consult. She reported she has pumped yesterday but not today, she wasn't sure if antibiotics was a contraindication for pumping. According to Medications and Mother's milk, ampicillin  (L1), azithromycin  (L2), gentamicin  (L2) and metronidazole  (L2) are most likely compatible with breastfeeding. Explained this to her and encouraged her to start pumping every 3 hours or around baby's feeding time. Noticed a colostrum container sitting at room temperature (from yesterday) and discarded it. Reviewed pumping schedule and breastmilk storage guidelines.  OBJECTIVE Infant data: Mother's Current Feeding Choice: Breast Milk and Formula  O2 Device: HHFNC O2 Flow Rate (L/min): 4 L/min FiO2 (%): 30 %  Infant feeding assessment IDFTS - Readiness: 1   Maternal data: G2P1001 C-Section, Low Transverse Has patient been taught Hand Expression?: Yes Current breast feeding challenges:: NICU admission Does the patient have breastfeeding experience prior to this delivery?: Yes How long did the patient breastfeed?: Per MOB, she breastfeed 1st child for 2 months due to returning to work. Pumping frequency: 1 time/24 hours; recommended q 3 hours Pumped volume: 3 mL Flange Size: 21 Risk factor for low/delayed milk supply:: infant separation  Pump: Received Stork Pump (Medela DEBP)  ASSESSMENT Infant: Feeding Status: Ad lib Feeding method: Bottle Nipple Type: Slow - flow  Maternal: Milk volume: Normal  INTERVENTIONS/PLAN Interventions: Interventions: Breast feeding basics  reviewed; Coconut oil; DEBP; Education Tools: Pump; Flanges; Coconut oil Pump Education: Setup, frequency, and cleaning; Milk Storage  Plan: Pump both breast every 3 hours, ideally 8 pumping sessions/24 hours Keep all pumped milk in the refrigerator Use coconut oil prior to pumping if possible  No other support person at this time; [name] holding baby in the NICU. All questions and concerns answered, family to contact [facility] services PRN.  Consult Status: NICU follow-up NICU Follow-up type: Maternal D/C visit   Linda Kidd 11/08/2023, 11:42 AM

## 2023-11-08 NOTE — Social Work (Signed)
 Patient screened out for psychosocial assessment since none of the following apply:  Psychosocial stressors documented in mother or baby's chart Gestation less than 32 weeks Code at delivery  Infant with anomalies  Please contact the Clinical Social Worker if specific needs arise, by MOB's request, or if MOB scores greater than 9/yes to question 10 on Edinburgh Postpartum Depression Screen.  Nat Quiet, MSW, LCSW Clinical Social Worker  [phone] 11/08/2023  1:44 PM

## 2023-11-08 NOTE — Progress Notes (Signed)
 POSTPARTUM PROGRESS NOTE  Post op Day 1 Subjective:  Linda Kidd is a 28 y.o. G2P1001 [redacted]w[redacted]d s/p rltcs.  No acute events overnight.  Pt denies problems with ambulating, voiding or po intake.  She denies nausea or vomiting.  Pain is well controlled.  She has had flatus. She has not had bowel movement.  Lochia Small. No fevers.  Objective: Blood pressure 101/70, pulse (!) 105, temperature 98.1 F (36.7 C), temperature source Oral, resp. rate 16, height 4' 11 (1.499 m), weight 78.7 kg, last menstrual period 01/23/2023, SpO2 99%, currently breastfeeding.  Physical Exam:  General: alert, cooperative and no distress Lochia:normal flow Chest: CTAB Heart: RRR no m/r/g Abdomen: +BS, soft, nontender,  Uterine Fundus: firm, mildly saturated dressing DVT Evaluation: No calf swelling or tenderness Extremities: trace LE edema  Recent Labs    11/07/23 1539 11/08/23 0533  HGB 10.3* 9.4*  HCT 31.5* 28.7*    Assessment/Plan:  ASSESSMENT: Linda Kidd is a 28 y.o. G2P1001 [redacted]w[redacted]d s/p rltcs, doing well. Bleeding and hgb appropriate will start oral iron . On lovenox  for dvt ppx. Undecided about birth control. Consented for circ. No fevers but didn't get abx after surgery, will order for 24 hours, should be fine to stop after that if remains afebrile. Bottle feeding, baby in nicu will thus probably stay the 3 days    LOS: 2 days   Devaughn KATHEE Ban 11/08/2023, 10:22 AM

## 2023-11-09 ENCOUNTER — Encounter: Payer: Self-pay | Admitting: Oncology

## 2023-11-09 ENCOUNTER — Other Ambulatory Visit (HOSPITAL_COMMUNITY): Payer: Self-pay

## 2023-11-09 LAB — SURGICAL PATHOLOGY

## 2023-11-09 MED ORDER — IBUPROFEN 600 MG PO TABS
600.0000 mg | ORAL_TABLET | Freq: Four times a day (QID) | ORAL | 0 refills | Status: AC
  Filled 2023-11-09: qty 30, 8d supply, fill #0

## 2023-11-09 MED ORDER — OXYCODONE HCL 5 MG PO TABS
5.0000 mg | ORAL_TABLET | Freq: Three times a day (TID) | ORAL | 0 refills | Status: AC | PRN
  Filled 2023-11-09: qty 15, 5d supply, fill #0

## 2023-11-09 MED ORDER — ACETAMINOPHEN 500 MG PO TABS
1000.0000 mg | ORAL_TABLET | Freq: Four times a day (QID) | ORAL | 0 refills | Status: AC | PRN
  Filled 2023-11-09: qty 30, 4d supply, fill #0

## 2023-11-09 NOTE — Lactation Note (Signed)
 This note was copied from a baby's chart.  NICU Lactation Consultation Note  Patient Name: Linda Kidd Unijb'd Date: 11/09/2023 Age:28 years  Reason for consult: Follow-up assessment; NICU baby; Exclusive pumping and bottle feeding; Term; Maternal discharge  SUBJECTIVE Visited with family of 26 80/72 weeks old NICU female Linda Kidd; Linda Kidd is a P2 and reported she's no longer pumping, she has decided to switch to formula, baby currently on Similac 20 calorie formula. She's getting discharged from the [facility]. Reviewed discharge education and provided lactation suppression education. [name] present. All questions and concerns answered, family to contact [facility] services PRN.  OBJECTIVE Infant data: Mother's Current Feeding Choice: Breast Milk and Formula  O2 Device: HHFNC O2 Flow Rate (L/min): 2 L/min FiO2 (%): 21 %  Infant feeding assessment IDFTS - Readiness: 5   Maternal data: G2P1001 C-Section, Low Transverse Current breast feeding challenges:: NICU admission Pumping frequency: Has not pumped the last 24 hours Pumped volume: 0 mL Flange Size: 21 Risk factor for low/delayed milk supply:: infant separation  Pump: Received Stork Pump (Medela DEBP)  ASSESSMENT Infant: Feeding Status: Scheduled 9-12-3-6 Feeding method: Tube/Gavage (Bolus) Nipple Type: Slow - flow  Maternal: Milk volume: Normal  INTERVENTIONS/PLAN Interventions: Interventions: Breast feeding basics reviewed; Education Discharge Education: Engorgement and breast care Tools: Pump; Flanges; Coconut oil Pump Education: Setup, frequency, and cleaning; Milk Storage  Plan: Consult Status: Complete NICU Follow-up type: Maternal D/C visit   Linda Kidd 11/09/2023, 12:06 PM

## 2023-11-13 ENCOUNTER — Encounter (HOSPITAL_COMMUNITY): Payer: Self-pay | Admitting: Family Medicine

## 2023-11-16 ENCOUNTER — Ambulatory Visit (INDEPENDENT_AMBULATORY_CARE_PROVIDER_SITE_OTHER)

## 2023-11-16 VITALS — BP 129/84 | HR 98 | Wt 160.0 lb

## 2023-11-16 DIAGNOSIS — Z98891 History of uterine scar from previous surgery: Secondary | ICD-10-CM

## 2023-11-16 NOTE — Progress Notes (Signed)
 Subjective:     Linda Kidd is a 28 y.o. female who presents to the clinic 1 weeks status post Repeat Low Transverse C-Section  . Pt reports incision is healing well.      Objective:    LMP 01/23/2023 (Exact Date)  General:  alert, well appearing, in no apparent distress  Incision:   healing well, no drainage, no erythema, no hernia, no seroma, no swelling, no dehiscence, incision well approximated     Assessment:    Doing well postoperatively.   Plan:    1. Continue any current medications. 2. Wound care discussed. 3. Follow up: Post Partum visit / PRN discussed precautions.   Sharene CHRISTELLA Butter, CMA

## 2023-12-21 ENCOUNTER — Ambulatory Visit: Admitting: Obstetrics and Gynecology

## 2023-12-21 ENCOUNTER — Encounter: Payer: Self-pay | Admitting: Obstetrics and Gynecology

## 2023-12-21 DIAGNOSIS — Z1331 Encounter for screening for depression: Secondary | ICD-10-CM | POA: Diagnosis not present

## 2023-12-21 NOTE — Progress Notes (Unsigned)
    Post Partum Visit Note  Linda Kidd is a 28 y.o. G2P1001 s/p 9/16 RLTCS for failed TOLAC at 41wks with chorio. Pregnancy uncomplicated except for h/o prior c/s for breech.   Anesthesia: spinal. Postpartum course has been well. Baby is doing well. Baby is feeding by bottle - Enfamil with Iron . Bleeding: thinks first period just started. Bowel function is normal. Bladder function is normal. Patient is not sexually active. Contraception method is unsure what kind to use. Postpartum depression screening: negative.  Pap neg 09/2021   Edinburgh Postnatal Depression Scale - 12/21/23 1052       Edinburgh Postnatal Depression Scale:  In the Past 7 Days   I have been able to laugh and see the funny side of things. 0    I have looked forward with enjoyment to things. 0    I have blamed myself unnecessarily when things went wrong. 0    I have been anxious or worried for no good reason. 0    I have felt scared or panicky for no good reason. 0    Things have been getting on top of me. 0    I have been so unhappy that I have had difficulty sleeping. 0    I have felt sad or miserable. 0    I have been so unhappy that I have been crying. 0    The thought of harming myself has occurred to me. 0    Edinburgh Postnatal Depression Scale Total 0         Review of Systems Pertinent items are noted in HPI.  Objective:  BP 128/87   Pulse 91   Ht 4' 11 (1.499 m)   Wt 154 lb (69.9 kg)   LMP 12/21/2023 (Exact Date)   Breastfeeding No   BMI 31.10 kg/m    General: NAD Abdomen: soft, nttp, nd. C/d/I incision, well healed.   Assessment:  Normal PP exam  Plan:  Routine care. Nothing for contraception. RTC 3 months for annual. D/w her Pap due this time next year.   Bebe Furry, MD [facility] [employer], [facility]

## 2023-12-22 ENCOUNTER — Encounter: Payer: Self-pay | Admitting: Obstetrics and Gynecology

## 2023-12-22 ENCOUNTER — Encounter: Payer: Self-pay | Admitting: Oncology

## 2023-12-29 ENCOUNTER — Encounter: Payer: Self-pay | Admitting: Oncology

## 2024-01-31 ENCOUNTER — Encounter: Payer: Self-pay | Admitting: Oncology

## 2024-02-07 ENCOUNTER — Encounter (HOSPITAL_COMMUNITY): Payer: Self-pay | Admitting: Family Medicine

## 2024-02-20 ENCOUNTER — Encounter: Payer: Self-pay | Admitting: Family Medicine

## 2024-02-20 ENCOUNTER — Ambulatory Visit: Admitting: Family Medicine

## 2024-02-20 NOTE — Progress Notes (Signed)
Patient did not keep appointment today. She will be called to reschedule.  

## 2024-03-05 ENCOUNTER — Inpatient Hospital Stay: Attending: Oncology

## 2024-03-06 ENCOUNTER — Inpatient Hospital Stay

## 2024-03-06 ENCOUNTER — Inpatient Hospital Stay: Admitting: Oncology

## 2024-04-01 ENCOUNTER — Inpatient Hospital Stay

## 2024-04-01 ENCOUNTER — Inpatient Hospital Stay: Attending: Oncology

## 2024-04-02 ENCOUNTER — Inpatient Hospital Stay: Admitting: Oncology

## 2024-04-02 ENCOUNTER — Inpatient Hospital Stay
